# Patient Record
Sex: Female | Born: 1975 | Race: White | Hispanic: No | Smoking: Former smoker
Health system: Southern US, Community
[De-identification: ages and names within clinical notes are randomized; demographics above are authoritative.]

## PROBLEM LIST (undated history)

## (undated) DIAGNOSIS — E785 Hyperlipidemia, unspecified: Secondary | ICD-10-CM

## (undated) DIAGNOSIS — E079 Disorder of thyroid, unspecified: Secondary | ICD-10-CM

## (undated) DIAGNOSIS — E282 Polycystic ovarian syndrome: Secondary | ICD-10-CM

## (undated) DIAGNOSIS — R519 Headache, unspecified: Secondary | ICD-10-CM

## (undated) DIAGNOSIS — E039 Hypothyroidism, unspecified: Secondary | ICD-10-CM

## (undated) DIAGNOSIS — M26629 Arthralgia of temporomandibular joint, unspecified side: Secondary | ICD-10-CM

## (undated) DIAGNOSIS — D239 Other benign neoplasm of skin, unspecified: Secondary | ICD-10-CM

## (undated) DIAGNOSIS — F419 Anxiety disorder, unspecified: Secondary | ICD-10-CM

## (undated) DIAGNOSIS — I499 Cardiac arrhythmia, unspecified: Secondary | ICD-10-CM

## (undated) DIAGNOSIS — E063 Autoimmune thyroiditis: Secondary | ICD-10-CM

## (undated) DIAGNOSIS — B009 Herpesviral infection, unspecified: Secondary | ICD-10-CM

## (undated) DIAGNOSIS — A630 Anogenital (venereal) warts: Secondary | ICD-10-CM

## (undated) HISTORY — PX: BREAST SURGERY: SHX581

## (undated) HISTORY — DX: Anxiety disorder, unspecified: F41.9

## (undated) HISTORY — DX: Disorder of thyroid, unspecified: E07.9

## (undated) HISTORY — DX: Other benign neoplasm of skin, unspecified: D23.9

---

## 2006-11-21 ENCOUNTER — Ambulatory Visit: Payer: Self-pay | Admitting: Obstetrics & Gynecology

## 2006-12-02 ENCOUNTER — Ambulatory Visit: Payer: Self-pay | Admitting: Unknown Physician Specialty

## 2007-01-29 ENCOUNTER — Inpatient Hospital Stay: Payer: Self-pay

## 2007-02-10 ENCOUNTER — Ambulatory Visit: Payer: Self-pay

## 2009-05-27 ENCOUNTER — Emergency Department: Payer: Self-pay | Admitting: Unknown Physician Specialty

## 2010-07-16 ENCOUNTER — Encounter: Payer: Self-pay | Admitting: Obstetrics & Gynecology

## 2010-11-27 ENCOUNTER — Observation Stay: Payer: Self-pay | Admitting: Obstetrics and Gynecology

## 2010-11-28 ENCOUNTER — Ambulatory Visit: Payer: Self-pay | Admitting: Obstetrics and Gynecology

## 2010-12-04 ENCOUNTER — Observation Stay: Payer: Self-pay | Admitting: Obstetrics and Gynecology

## 2011-01-07 ENCOUNTER — Inpatient Hospital Stay: Payer: Self-pay | Admitting: Obstetrics and Gynecology

## 2013-11-15 ENCOUNTER — Ambulatory Visit: Payer: Self-pay | Admitting: Obstetrics and Gynecology

## 2013-12-08 ENCOUNTER — Ambulatory Visit: Payer: Self-pay | Admitting: Obstetrics and Gynecology

## 2013-12-28 ENCOUNTER — Ambulatory Visit: Payer: Self-pay | Admitting: Obstetrics and Gynecology

## 2013-12-29 LAB — PATHOLOGY REPORT

## 2014-09-19 DIAGNOSIS — E038 Other specified hypothyroidism: Secondary | ICD-10-CM | POA: Insufficient documentation

## 2014-09-19 DIAGNOSIS — M26629 Arthralgia of temporomandibular joint, unspecified side: Secondary | ICD-10-CM | POA: Insufficient documentation

## 2014-09-19 DIAGNOSIS — G5600 Carpal tunnel syndrome, unspecified upper limb: Secondary | ICD-10-CM | POA: Insufficient documentation

## 2014-09-19 DIAGNOSIS — E039 Hypothyroidism, unspecified: Secondary | ICD-10-CM | POA: Insufficient documentation

## 2014-09-19 DIAGNOSIS — E063 Autoimmune thyroiditis: Secondary | ICD-10-CM

## 2014-09-19 DIAGNOSIS — E785 Hyperlipidemia, unspecified: Secondary | ICD-10-CM | POA: Insufficient documentation

## 2014-11-07 DIAGNOSIS — IMO0002 Reserved for concepts with insufficient information to code with codable children: Secondary | ICD-10-CM | POA: Insufficient documentation

## 2015-06-11 IMAGING — US US BIOPSY BREAST CORE W/ IMAGING
1 series · 8 of 8 positions shown · non-contrast
Comparison: Previous exams.

CLINICAL DATA: 36-year-old female with a left breast mass at 3
o'clock 5 cm from the nipple.

EXAM:
ULTRASOUND GUIDED LEFT BREAST CORE NEEDLE BIOPSY

[Series 1: us biopsy breast core w/ imaging · 0.08mm/px · 8 of 8 slices shown]
[im 1/8]
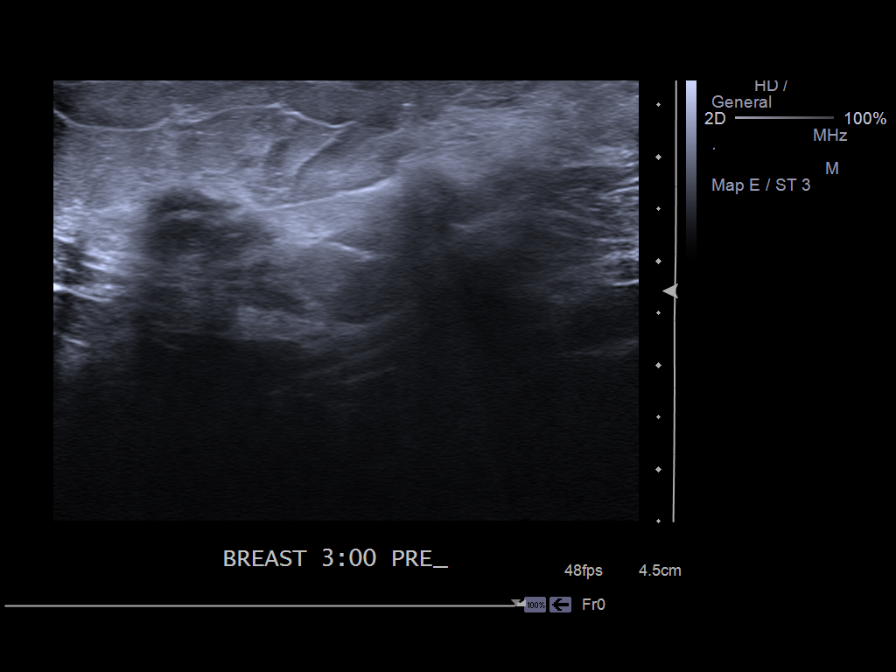
[im 2/8]
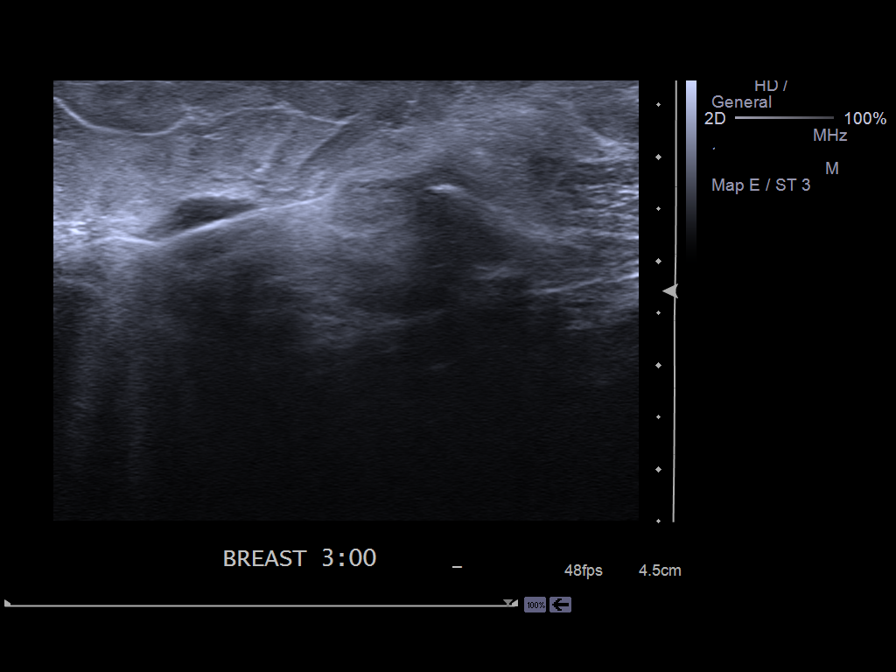
[im 3/8]
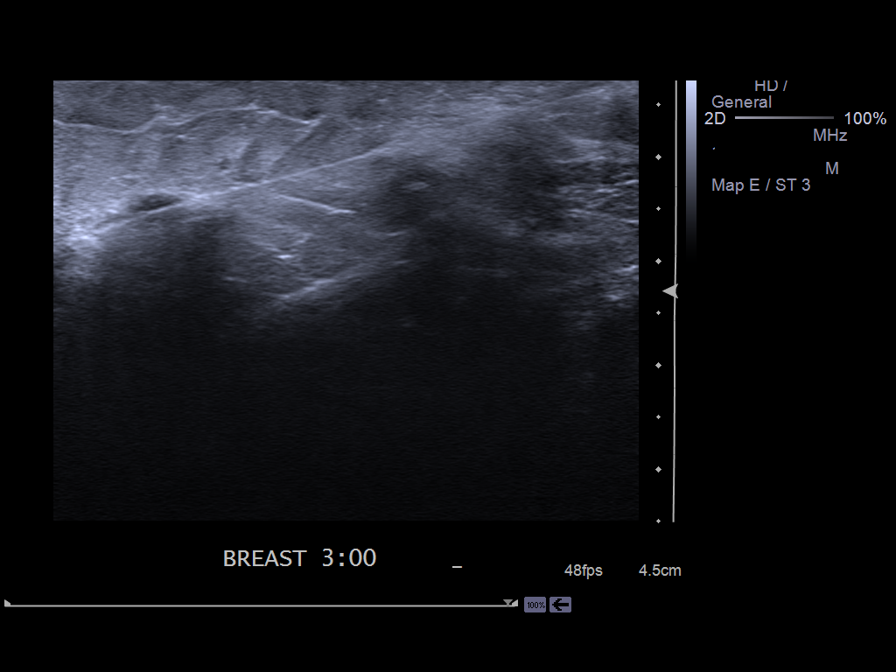
[im 4/8]
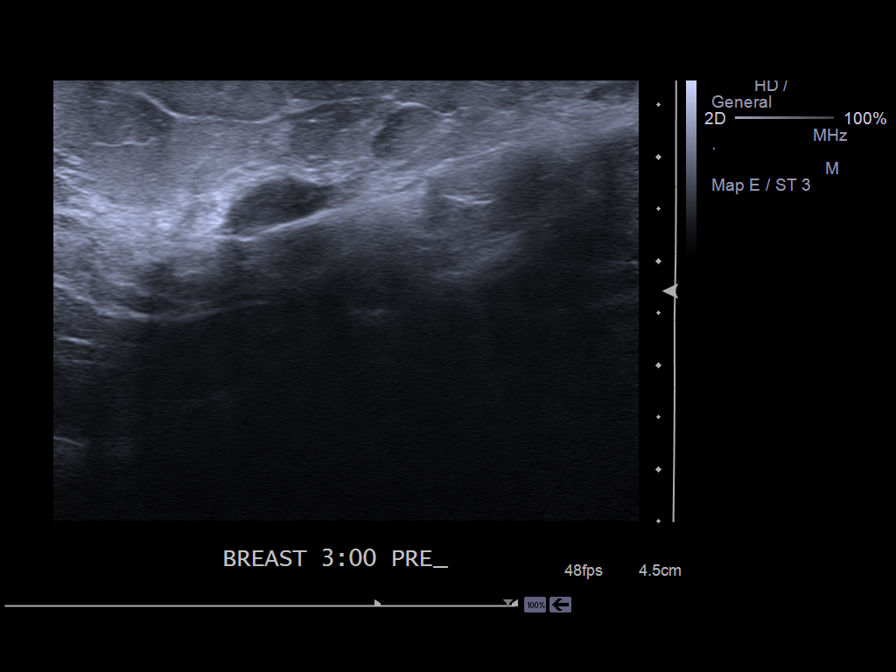
[im 5/8]
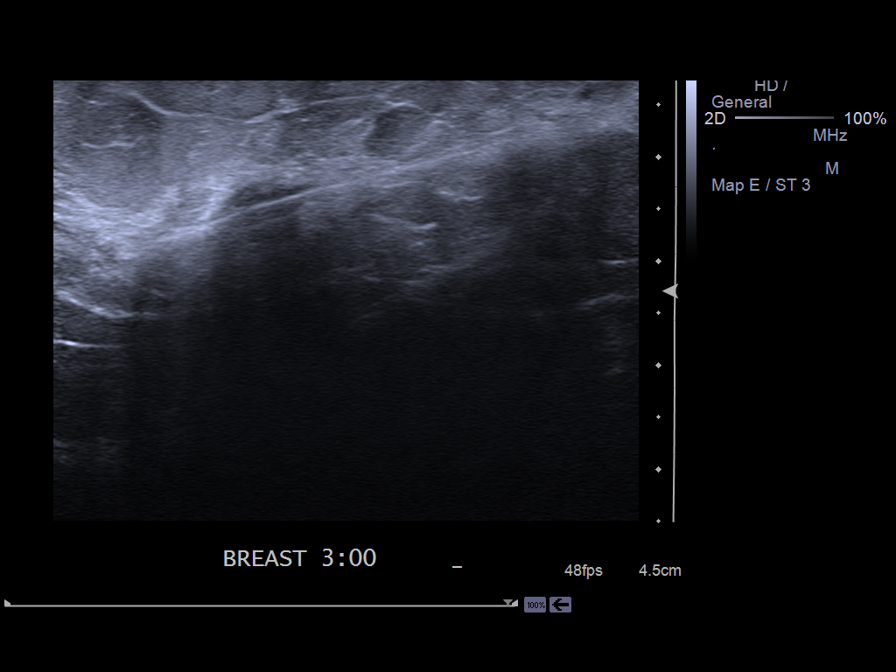
[im 6/8]
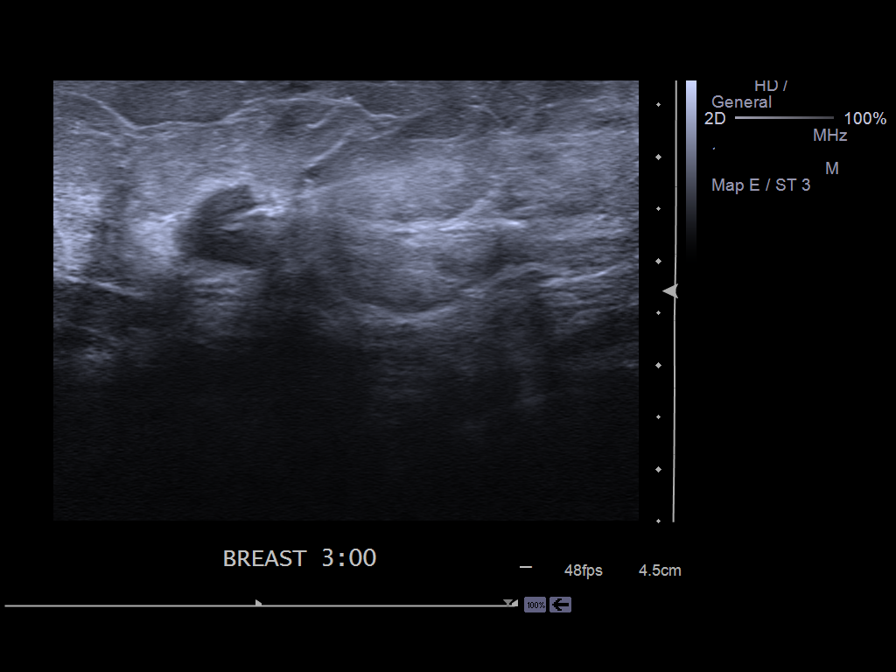
[im 7/8]
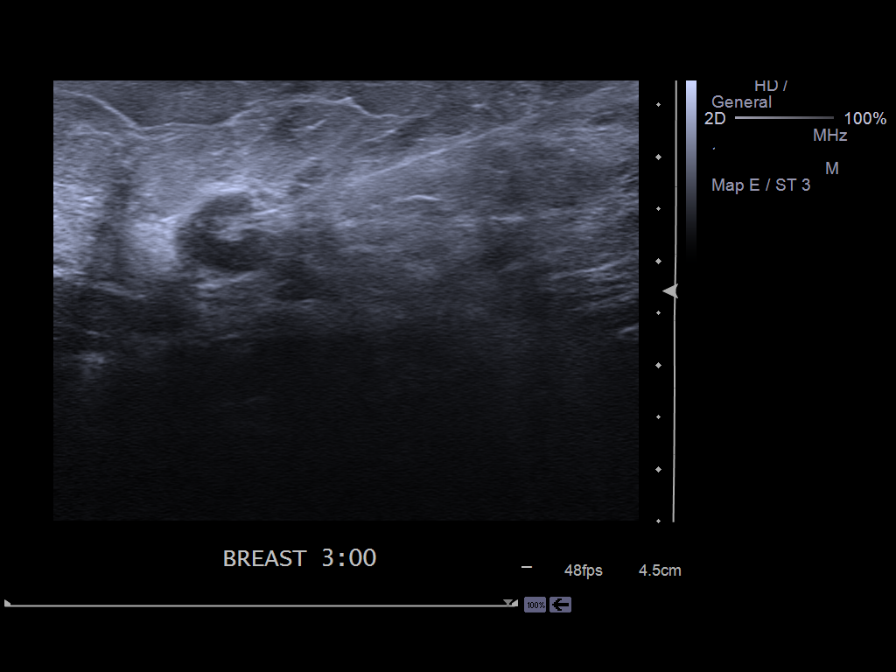
[im 8/8]
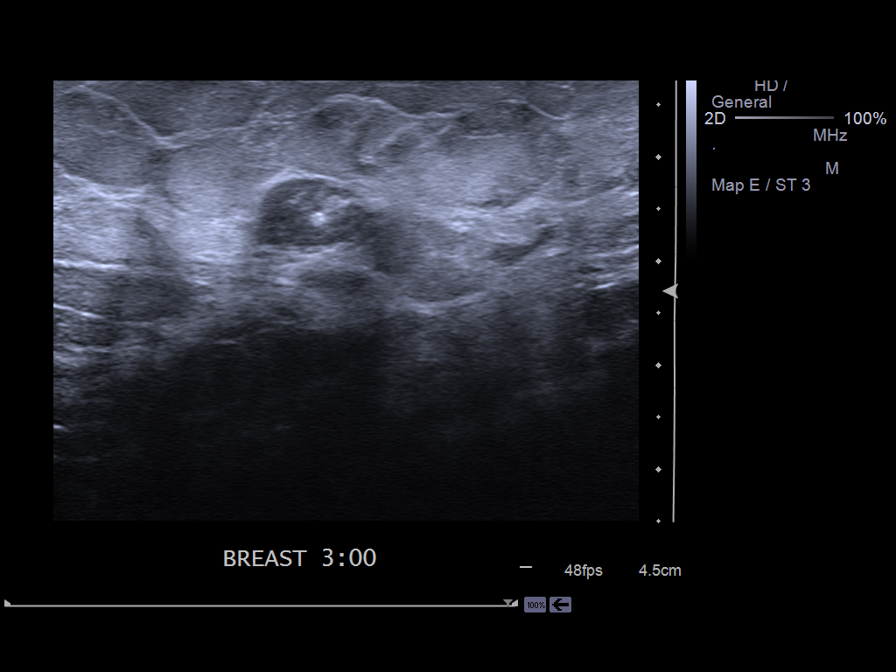

[8 of 8 positions shown; findings below may reference images not displayed]

FINDINGS: I met with the patient and we discussed the procedure o
fultrasound-guided biopsy, including benefits and alternatives. We
discussed the high likelihood of a successful procedure. We
discussed the risks of the procedure, including infection, bleeding,
tissue injury, clip migration, and inadequate sampling. Informed
written consent was given. The usual time-out protocol was performed
immediately prior to the procedure.

Using sterile technique and 2% Lidocaine as local anesthetic, under
direct ultrasound visualization, a 12 gauge vacuum device was used
to perform biopsy of the oval circumscribed mass in the left breast
at 3 o'clock 5 cm from the nipple using a lateral approach. At the
conclusion of the procedure a coil shaped tissue marker clip was
deployed into the biopsy cavity. Follow up 2 view mammogram was
performed and dictated separately.
IMPRESSION: Ultrasound guided biopsy of a left breast mass at 3 o'clock 5 cm
from the nipple. No apparent complications.

## 2015-10-03 ENCOUNTER — Ambulatory Visit: Payer: Self-pay | Admitting: Psychiatry

## 2015-10-11 ENCOUNTER — Ambulatory Visit (INDEPENDENT_AMBULATORY_CARE_PROVIDER_SITE_OTHER): Payer: BLUE CROSS/BLUE SHIELD | Admitting: Psychiatry

## 2015-10-11 ENCOUNTER — Encounter: Payer: Self-pay | Admitting: Psychiatry

## 2015-10-11 VITALS — BP 122/96 | HR 90 | Temp 98.7°F | Ht 62.0 in | Wt 157.0 lb

## 2015-10-11 DIAGNOSIS — F4323 Adjustment disorder with mixed anxiety and depressed mood: Secondary | ICD-10-CM

## 2015-10-11 NOTE — Progress Notes (Signed)
Psychiatric Initial Adult Assessment   Patient Identification: Linda Michael MRN:  [id number] Date of Evaluation:  10/11/2015 Referral Source: Self for/endocrinologist Chief Complaint:  "I'm here reluctantly." "So much in my life" "infidelity." Chief Complaint    Establish Care; Anxiety; Depression; Panic Attack; Fatigue; Stress     Visit Diagnosis:    ICD-9-CM ICD-10-CM   1. Adjustment disorder with mixed anxiety and depressed mood 309.28 F43.23    Diagnosis:   Patient Active Problem List   Diagnosis Date Noted  . Adult BMI 30+ [E66.8] 11/07/2014  . Carpal tunnel syndrome [G56.00] 09/19/2014  . HLD (hyperlipidemia) [E78.5] 09/19/2014  . Hypothyroidism due to Hashimoto's thyroiditis [E06.3] 09/19/2014  . 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 Adult hypothyroidism [E03.9] 09/19/2014   History of Present Illness:  Patient indicates that she has had past issues with depression. She states that over the past 3-4 years there have been issues with infidelity in her marriage. She states that subsequent to that she's been experiencing various emotions such as being irritable, sad, crying and some anxiety. She states that nothing tends to linger for continuous days but that it all depends on what is happening that day. For example she states that if she is sees something that reminds her of something that happened in the past such as the death of her grandmother which occurred in 2014 or someone that reminds her of the infidelity reportedly on her husband's part that then she began become emotional. We discussed whether she was having symptoms consistent with a major depressive disorder and it appeared that was not a case at this time.  He stated that she does occasionally have panic attacks where her chest feels tight, sharp palpitations. However she states that typically these are triggered when she is about to have an unpleasant conversation with someone.  She states that this first occurred about a year and a half ago and her primary care gave her some Xanax for which she still has the same supply. She states that she's not sure what the milligrams were but she states it was the "smallest." And that she would take a half of the tablets and felt like it probably work. She states she does not use this on a regular basis.  Review reviewed symptoms of mania and she denied any symptoms consistent with a hypomanic or manic episode. He states she still goes to work and is able to enjoy her children. She states that some avoidance in the relationship with her husband for example she states sometimes he'll be able to talk about the problems other times she might avoid it because she does not want to make him depressed. She states she is invested in saving the marriage because she wants to remain in intact family and provide a good family structure for her children. She states that she went through some employee-related counseling and then they discussed that she might want to see about medications. She states she discusses with her endocrinologist and then felt more comfortable with her seeing a psychiatrist.  He said in the past she was treated with some antidepressants when she was 39 years old. She states this was in the context of moving out of her father's home. She states at that time she had left her mother's home and was living with her father and her brother. She states she was largely the caretaker and functioned more and a mother role in that household. However she states she then met her current  husband and she moved out of the house. She states she was somewhat sad for leaving them and thus got started on some Zoloft secondary to having some guilt for leaving the house. She states she might stay on for 6 months to one year and then felt better. She states then probably around 11 years ago she took some Wellbutrin for about a half a year and felt good on that  medicine and then eventually stopped it.   Elements:  Duration:  As discussed above. Associated Signs/Symptoms: Depression Symptoms:  depressed mood, anxiety, panic attacks, loss of energy/fatigue, States she'll get irritable with people (Hypo) Manic Symptoms:  None Anxiety Symptoms:  She relates she'll some anxiety about unpleasant conversations. She states she has panic attacks but sons occur prior to having a conversation and sometimes after the conversation. She states she sometimes conscious and worries about what others think of her Psychotic Symptoms:  None PTSD Symptoms: NA  Past Medical History:  Past Medical History  Diagnosis Date  . Anxiety   . Thyroid disease     Past Surgical History  Procedure Laterality Date  . Cesarean section     Family History:  Family History  Problem Relation Age of Onset  . Drug abuse Mother   . Alcohol abuse Mother   . Depression Mother   . Anxiety disorder Mother   . Cervical polyp Mother   . Thyroid disease Mother   . Fibromyalgia Mother   . COPD Mother   . Drug abuse Father   . Anxiety disorder Father   . Depression Father   . Hypertension Father    Social History:   Social History   Social History  . Marital Status: Married    Spouse Name: N/A  . Number of Children: N/A  . Years of Education: N/A   Social History Main Topics  . Smoking status: Former Smoker    Types: Cigarettes    Start date: 10/10/1994    Quit date: 08/30/2013  . Smokeless tobacco: Never Used  . Alcohol Use: No  . Drug Use: No  . Sexual Activity: Yes    Birth Control/ Protection: None   Other Topics Concern  . None   Social History Narrative  . None   Additional Social History: Patient states that her parents separated when she was young. She states that both parents had some issues with drugs and alcohol. States her mother things were more related to alcohol use and she patient reports there may have been some addiction to pain  medications. She states her father had sporadic use of marijuana and some other drugs. She states it was never continuous but she states she was exposed to things that she probably should not have been as a child. She states that her father has been treated with antidepressants and believes he was treated with Celexa.  She relates that her grandmother was largely her maternal figure and that grandmother died in 03/09/13.  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 However since she left because she did not think she wants to go into the Insurance claims handler. 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  Musculoskeletal: Strength & Muscle Tone: within normal limits Gait & Station: normal Patient leans: N/A  Psychiatric Specialty Exam: HPI  Review of Systems  Endo/Heme/Allergies:  Hypothyroidism is currently treated TSH on 07/28/2015 was normal at 1.744  Psychiatric/Behavioral: Positive for depression. Negative for suicidal ideas, hallucinations, memory loss and substance abuse. The patient is nervous/anxious. The patient does not have insomnia.   All other systems reviewed and are negative.   Blood pressure 122/96, pulse 90, temperature 98.7 F (37.1 C), temperature source Tympanic, height 5' 2"  (1.575 m), weight 157 lb (71.215 kg), last menstrual period 09/19/2015, SpO2 95 %.Body mass index is 28.71 kg/(m^2).  General Appearance: Neat and Well Groomed  Eye Contact:  Good  Speech:  Normal Rate  Volume:  Normal  Mood:  Depression  Affect:  Appropriate and Constricted but able to smile  Thought Process:  Linear and Logical  Orientation:  Full (Time, Place, and Person)  Thought Content:  Negative  Suicidal Thoughts:  No  Homicidal Thoughts:  No  Memory:  Immediate;   Good Recent;   Good Remote;   Good   Judgement:  Good  Insight:  Good  Psychomotor Activity:  Negative  Concentration:  Good  Recall:  Good  Fund of Knowledge:Good  Language: Good  Akathisia:  Negative  Handed:    AIMS (if indicated): N/A  Assets:  Communication Skills Desire for Improvement Social Support Vocational/Educational  ADL's:  Intact  Cognition: WNL  Sleep:  Good   Is the patient at risk to self?  No. Has the patient been a risk to self in the past 6 months?  No. Has the patient been a risk to self within the distant past?  No. Is the patient a risk to others?  No. Has the patient been a risk to others in the past 6 months?  No. Has the patient been a risk to others within the distant past?  No.  Allergies:   Allergies  Allergen Reactions  . Sulfa Antibiotics Rash   Current Medications: Current Outpatient Prescriptions  Medication Sig Dispense Refill  . levothyroxine (LEVOXYL) 50 MCG tablet Take by mouth.    . simvastatin (ZOCOR) 40 MG tablet Take 40 mg by mouth.     No current facility-administered medications for this visit.    Previous Psychotropic Medications: Yes  As discussed above previous trials of Wellbutrin with good results. She states she's been on Zoloft and Prozac several years ago. Substance Abuse History in the last 12 months:  No. Patient states that she does not drink or use illicit drugs. She states that she did use cigarettes in her early 90s and then stopped for her pregnancies. She states she started back in 2013 in the midst of finding out about the affair but then quit in September 2014. Consequences of Substance Abuse: NA  Medical Decision Making:  New Problem, with no additional work-up planned (3)  Treatment Plan Summary: Plan Adjustment disorder with depressed she and an anxiety. Patient describes a lot of her emotions surrounding infidelity within her marriage over the past 3 or 4 years. She does not endorse symptoms which are consistent with a major depressive  disorder. She states she is able to function, go to work take care of her kids and enjoy her kids. She does not feel as though she is depressed constantly or her anxieties to level where it's impacting her ability to function. She is aware that counseling and therapy may address some of her issues, however she states that financially she is unable to engage in that treatment at this time. We spent time discussing some medication options such as medications for anxiety or  perhaps using an antidepressant to perhaps help her with her low moments. However patient indicated that she does think therapy is probably the best starting point and does not want to take any medications at this time. She stated that she will contact the clinic in the future should she decide to engage in medication management or therapy.  In regards to risk assessment the patient does have a history of being treated for affective illness and race as risk factors. She has protective factors of no past suicide attempts, minor children living in the home, some marital support although there is some stress within the marriage, employment, forward thinking and good insight. At this time low risk of imminent harm to self or others.    Faith Rogue 10/12/20164:17 PM

## 2020-04-06 ENCOUNTER — Encounter: Payer: Self-pay | Admitting: Dermatology

## 2020-04-06 ENCOUNTER — Ambulatory Visit (INDEPENDENT_AMBULATORY_CARE_PROVIDER_SITE_OTHER): Payer: BC Managed Care – PPO | Admitting: Dermatology

## 2020-04-06 ENCOUNTER — Other Ambulatory Visit: Payer: Self-pay

## 2020-04-06 DIAGNOSIS — Z1283 Encounter for screening for malignant neoplasm of skin: Secondary | ICD-10-CM

## 2020-04-06 DIAGNOSIS — L853 Xerosis cutis: Secondary | ICD-10-CM

## 2020-04-06 DIAGNOSIS — L720 Epidermal cyst: Secondary | ICD-10-CM

## 2020-04-06 DIAGNOSIS — D229 Melanocytic nevi, unspecified: Secondary | ICD-10-CM

## 2020-04-06 DIAGNOSIS — D2272 Melanocytic nevi of left lower limb, including hip: Secondary | ICD-10-CM

## 2020-04-06 DIAGNOSIS — L820 Inflamed seborrheic keratosis: Secondary | ICD-10-CM | POA: Diagnosis not present

## 2020-04-06 DIAGNOSIS — L578 Other skin changes due to chronic exposure to nonionizing radiation: Secondary | ICD-10-CM

## 2020-04-06 DIAGNOSIS — S40862A Insect bite (nonvenomous) of left upper arm, initial encounter: Secondary | ICD-10-CM | POA: Diagnosis not present

## 2020-04-06 DIAGNOSIS — L821 Other seborrheic keratosis: Secondary | ICD-10-CM

## 2020-04-06 DIAGNOSIS — D1800 Hemangioma unspecified site: Secondary | ICD-10-CM

## 2020-04-06 DIAGNOSIS — S40852A Superficial foreign body of left upper arm, initial encounter: Secondary | ICD-10-CM

## 2020-04-06 DIAGNOSIS — L814 Other melanin hyperpigmentation: Secondary | ICD-10-CM

## 2020-04-06 DIAGNOSIS — W57XXXA Bitten or stung by nonvenomous insect and other nonvenomous arthropods, initial encounter: Secondary | ICD-10-CM

## 2020-04-06 DIAGNOSIS — L988 Other specified disorders of the skin and subcutaneous tissue: Secondary | ICD-10-CM

## 2020-04-06 MED ORDER — DOXYCYCLINE MONOHYDRATE 100 MG PO CAPS: ORAL_CAPSULE | ORAL | 0 refills | Status: DC

## 2020-04-06 NOTE — Progress Notes (Signed)
Follow-Up Visit   Subjective  Linda Michael is a 44 y.o. female who presents for the following: Annual Exam (No history of skin cancer or abnormal moles).  Patient is here for skin cancer screening and mole check.   Last full skin exam: ~1 year New or changing lesions: none Otherwise denies growing, bleeding, or unhealing skin lesions.  Practices photoprotection with use sunscreen on face daily.  Tanning bed use: years ago History of blistering sunburns: more than 30 years ago   The following portions of the chart were reviewed this encounter and updated as appropriate: Tobacco  Allergies  Meds  Problems  Med Hx  Surg Hx  Fam Hx      Review of Systems: No other skin or systemic complaints.  Objective  Well appearing patient in no apparent distress; mood and affect are within normal limits.  A full examination was performed including scalp, head, eyes, ears, nose, lips, neck, chest, axillae, abdomen, back, buttocks, bilateral upper extremities, bilateral lower extremities, hands, feet, fingers, toes, fingernails, and toenails. All findings within normal limits unless otherwise noted below.  Objective  Face: Rhytides   Objective  Left Upper Arm - Anterior: Attached tick today.  Objective  left lat canthus: Smooth white papules  Objective  Left Middle Plantar Surface: 0.3 cm brown macule  Objective  Legs: Xerosis  Assessment & Plan    Skin cancer screening performed today.  Actinic Damage - diffuse scaly erythematous macules with underlying dyspigmentation - Recommend daily broad spectrum sunscreen SPF 30+ to sun-exposed areas, reapply every 2 hours as needed.  - Call for new or changing lesions.  Lentigines - Scattered tan macules - Discussed due to sun exposure - Benign, observe - Call for any changes  Seborrheic Keratoses - Stuck-on, waxy, tan-brown papules and plaques  - Discussed benign etiology and prognosis. - Observe - Call for any  changes  Melanocytic Nevi - Tan-brown and/or pink-flesh-colored symmetric macules and papules - Benign appearing on exam today - Observation - Call clinic for new or changing moles - Recommend daily use of broad spectrum spf 30+ sunscreen to sun-exposed areas.   Hemangiomas - Red papules - Discussed benign nature - Observe - Call for any changes  Elastosis of skin Face  Recommend The Perfect A daily   Topical retinoid medications like The Perfect A/tretinoin/Retin-A, adapalene/Differin, tazarotene/Fabior, and Epiduo/Epiduo Forte can cause dryness and irritation when first started. Only apply a pea-sized amount to the entire affected area. Avoid applying it around the eyes, edges of mouth and creases at the nose. If you experience irritation, use a good moisturizer first and/or apply the medicine less often. If you are doing well with the medicine, you can increase how often you use it until you are applying every night. Be careful with sun protection while using this medication as it can make you sensitive to the sun. This medicine should not be used by pregnant women.    Inflamed seborrheic keratosis (6) Right Shoulder - Anterior; Right Neck (4); Left Breast  Prior to procedure, discussed risks of blister formation, small wound, skin dyspigmentation, or rare scar following cryotherapy.    Destruction of lesion - Left Breast, Right Neck, Right Shoulder - Anterior  Destruction method: cryotherapy   Informed consent: discussed and consent obtained   Lesion destroyed using liquid nitrogen: Yes   Outcome: patient tolerated procedure well with no complications   Post-procedure details: wound care instructions given    Insect bite of left upper arm with  local reaction, initial encounter Left Upper Arm - Anterior  Deer tick removed today. Area prepped with isopropyl alcohol. Tick removed gently with forceps, including the head. Area cleansed again with alcohol.  Unclear how long  the tick has been attached. Will give prophylaxis for lyme disease 200 mg once with food.   doxycycline (MONODOX) 100 MG capsule - Left Upper Arm - Anterior  Milia left lat canthus  Observe Advised that The Perfect A can help with this but use minimally near the eyes due to risk of irritation   Nevus Left Middle Plantar Surface  Benign-appearing.  Observation.  Call clinic for new or changing moles.  Recommend daily use of broad spectrum spf 30+ sunscreen to sun-exposed areas.    Xerosis cutis Legs  Recommend Ammonium Lactate lotion.  Return in about 1 year (around 04/06/2021) for TBSE.   I, Ashok Cordia, CMA, am acting as scribe for Forest Gleason, MD .  Documentation: I have reviewed the above documentation for accuracy and completeness, and I agree with the above.  Forest Gleason, MD

## 2020-04-06 NOTE — Patient Instructions (Addendum)
Recommend daily broad spectrum sunscreen SPF 30+ to sun-exposed areas, reapply every 2 hours as needed. Call for new or changing lesions.   Recommend Ammonium Lactate lotion daily for dryness.  Recommend The Perfect A for sun spots and wrinkles (can also help milia- the tiny white cysts). Topical retinoid medications like tretinoin/Retin-A, adapalene/Differin, tazarotene/Fabior, and Epiduo/Epiduo Forte can cause dryness and irritation when first started. Only apply a pea-sized amount to the entire affected area. Avoid applying it around the eyes, edges of mouth and creases at the nose. If you experience irritation, use a good moisturizer first and/or apply the medicine less often. If you are doing well with the medicine, you can increase how often you use it until you are applying every night. Be careful with sun protection while using this medication as it can make you sensitive to the sun. This medicine should not be used by pregnant women.   Melanoma ABCDEs  Melanoma is the most dangerous type of skin cancer, and is the leading cause of death from skin disease.  You are more likely to develop melanoma if you:  Have light-colored skin, light-colored eyes, or red or blond hair  Spend a lot of time in the sun  Tan regularly, either outdoors or in a tanning bed  Have had blistering sunburns, especially during childhood  Have a close family member who has had a melanoma  Have atypical moles or large birthmarks  Early detection of melanoma is key since treatment is typically straightforward and cure rates are extremely high if we catch it early.   The first sign of melanoma is often a change in a mole or a new dark spot.  The ABCDE system is a way of remembering the signs of melanoma.  A for asymmetry:  The two halves do not match. B for border:  The edges of the growth are irregular. C for color:  A mixture of colors are present instead of an even brown color. D for diameter:  Melanomas  are usually (but not always) greater than 24mm - the size of a pencil eraser. E for evolution:  The spot keeps changing in size, shape, and color.  Please check your skin once per month between visits. You can use a small mirror in front and a large mirror behind you to keep an eye on the back side or your body.   If you see any new or changing lesions before your next follow-up, please call to schedule a visit.  Please continue daily skin protection including broad spectrum sunscreen SPF 30+ to sun-exposed areas, reapplying every 2 hours as needed when you're outdoors.

## 2021-04-12 ENCOUNTER — Encounter: Payer: BC Managed Care – PPO | Admitting: Dermatology

## 2021-08-23 ENCOUNTER — Other Ambulatory Visit: Payer: Self-pay

## 2021-08-23 ENCOUNTER — Ambulatory Visit (INDEPENDENT_AMBULATORY_CARE_PROVIDER_SITE_OTHER): Payer: BC Managed Care – PPO | Admitting: Dermatology

## 2021-08-23 DIAGNOSIS — L820 Inflamed seborrheic keratosis: Secondary | ICD-10-CM | POA: Diagnosis not present

## 2021-08-23 DIAGNOSIS — L814 Other melanin hyperpigmentation: Secondary | ICD-10-CM | POA: Diagnosis not present

## 2021-08-23 DIAGNOSIS — D225 Melanocytic nevi of trunk: Secondary | ICD-10-CM | POA: Diagnosis not present

## 2021-08-23 DIAGNOSIS — L578 Other skin changes due to chronic exposure to nonionizing radiation: Secondary | ICD-10-CM

## 2021-08-23 DIAGNOSIS — L821 Other seborrheic keratosis: Secondary | ICD-10-CM | POA: Diagnosis not present

## 2021-08-23 DIAGNOSIS — Z1283 Encounter for screening for malignant neoplasm of skin: Secondary | ICD-10-CM | POA: Diagnosis not present

## 2021-08-23 DIAGNOSIS — D229 Melanocytic nevi, unspecified: Secondary | ICD-10-CM

## 2021-08-23 DIAGNOSIS — D485 Neoplasm of uncertain behavior of skin: Secondary | ICD-10-CM

## 2021-08-23 DIAGNOSIS — D1800 Hemangioma unspecified site: Secondary | ICD-10-CM

## 2021-08-23 NOTE — Progress Notes (Signed)
Follow-Up Visit   Subjective  Linda Michael is a 45 y.o. female who presents for the following: Annual Exam (No hx of dysplastic nevi or skin CA - patient has noticed irregular, irritated lesions on the L upper arm, R arm, and back x 2).  The following portions of the chart were reviewed this encounter and updated as appropriate:   Tobacco  Allergies  Meds  Problems  Med Hx  Surg Hx  Fam Hx     Review of Systems:  No other skin or systemic complaints except as noted in HPI or Assessment and Plan.  Objective  Well appearing patient in no apparent distress; mood and affect are within normal limits.  A full examination was performed including scalp, head, eyes, ears, nose, lips, neck, chest, axillae, abdomen, back, buttocks, bilateral upper extremities, bilateral lower extremities, hands, feet, fingers, toes, fingernails, and toenails. All findings within normal limits unless otherwise noted below.  Assessment & Plan  Inflamed seborrheic keratosis L upper back, L sup shoulder, L upper arm  Destruction of lesion - L upper back, L sup shoulder, L upper arm Complexity: simple   Destruction method: cryotherapy   Informed consent: discussed and consent obtained   Timeout:  patient name, date of birth, surgical site, and procedure verified Lesion destroyed using liquid nitrogen: Yes   Region frozen until ice ball extended beyond lesion: Yes   Outcome: patient tolerated procedure well with no complications   Post-procedure details: wound care instructions given    Neoplasm of uncertain behavior of skin Upper back L of midline  Epidermal / dermal shaving  Lesion diameter (cm):  0.5 Informed consent: discussed and consent obtained   Timeout: patient name, date of birth, surgical site, and procedure verified   Procedure prep:  Patient was prepped and draped in usual sterile fashion Prep type:  Isopropyl alcohol Anesthesia: the lesion was anesthetized in a standard fashion    Anesthetic:  1% lidocaine w/ epinephrine 1-100,000 buffered w/ 8.4% NaHCO3 Instrument used: flexible razor blade   Hemostasis achieved with: pressure, aluminum chloride and electrodesiccation   Outcome: patient tolerated procedure well   Post-procedure details: sterile dressing applied and wound care instructions given   Dressing type: bandage and petrolatum    Specimen 1 - Surgical pathology Differential Diagnosis: D48.5 r/o dysplastic nevus  Check Margins: No  Lentigines - Scattered tan macules - Due to sun exposure - Benign-appering, observe - Recommend daily broad spectrum sunscreen SPF 30+ to sun-exposed areas, reapply every 2 hours as needed. - Call for any changes  Seborrheic Keratoses - Stuck-on, waxy, tan-brown papules and/or plaques  - Benign-appearing - Discussed benign etiology and prognosis. - Observe - Call for any changes  Melanocytic Nevi - Tan-brown and/or pink-flesh-colored symmetric macules and papules - Benign appearing on exam today - Observation - Call clinic for new or changing moles - Recommend daily use of broad spectrum spf 30+ sunscreen to sun-exposed areas.   Hemangiomas - Red papules - Discussed benign nature - Observe - Call for any changes  Actinic Damage - Chronic condition, secondary to cumulative UV/sun exposure - diffuse scaly erythematous macules with underlying dyspigmentation - Recommend daily broad spectrum sunscreen SPF 30+ to sun-exposed areas, reapply every 2 hours as needed.  - Staying in the shade or wearing long sleeves, sun glasses (UVA+UVB protection) and wide brim hats (4-inch brim around the entire circumference of the hat) are also recommended for sun protection.  - Call for new or changing lesions.  Skin  cancer screening performed today.  Return in about 1 year (around 08/23/2022) for TBSE.  Luther Redo, CMA, am acting as scribe for Forest Gleason, MD .  Documentation: I have reviewed the above documentation for  accuracy and completeness, and I agree with the above.  Forest Gleason, MD

## 2021-08-23 NOTE — Patient Instructions (Addendum)
If you have any questions or concerns for your doctor, please call our main line at [phone] and press option 4 to reach your doctor's medical assistant. If no one answers, please leave a voicemail as directed and we will return your call as soon as possible. Messages left after 4 pm will be answered the following business day.   You may also send us a message via MyChart. We typically respond to MyChart messages within 1-2 business days.  For prescription refills, please ask your pharmacy to contact our office. Our fax number is [phone].  If you have an urgent issue when the clinic is closed that cannot wait until the next business day, you can page your doctor at the number below.    Please note that while we do our best to be available for urgent issues outside of office hours, we are not available 24/7.   If you have an urgent issue and are unable to reach us, you may choose to seek medical care at your doctor's office, retail clinic, urgent care center, or emergency room.  If you have a medical emergency, please immediately call 911 or go to the emergency department.  Pager Numbers  - Dr. Kowalski: [phone]  - Dr. Moye: [phone]  - Dr. Stewart: [phone]  In the event of inclement weather, please call our main line at [phone] for an update on the status of any delays or closures.  Dermatology Medication Tips: Please keep the boxes that topical medications come in in order to help keep track of the instructions about where and how to use these. Pharmacies typically print the medication instructions only on the boxes and not directly on the medication tubes.   If your medication is too expensive, please contact our office at [phone] option 4 or send us a message through MyChart.   We are unable to tell what your co-pay for medications will be in advance as this is different depending on your insurance coverage. However, we may be able to find a substitute  medication at lower cost or fill out paperwork to get insurance to cover a needed medication.   If a prior authorization is required to get your medication covered by your insurance company, please allow us 1-2 business days to complete this process.  Drug prices often vary depending on where the prescription is filled and some pharmacies may offer cheaper prices.  The website www.goodrx.com contains coupons for medications through different pharmacies. The prices here do not account for what the cost may be with help from insurance (it may be cheaper with your insurance), but the website can give you the price if you did not use any insurance.  - You can print the associated coupon and take it with your prescription to the pharmacy.  - You may also stop by our office during regular business hours and pick up a GoodRx coupon card.  - If you need your prescription sent electronically to a different pharmacy, notify our office through [facility] MyChart or by phone at [phone] option 4.   Wound Care Instructions  Cleanse wound gently with soap and water once a day then pat dry with clean gauze. Apply a thing coat of Petrolatum (petroleum jelly, "Vaseline") over the wound (unless you have an allergy to this). We recommend that you use a new, sterile tube of Vaseline. Do not pick or remove scabs. Do not remove the yellow or white "healing tissue" from the base of the wound.  Cover the   wound with fresh, clean, nonstick gauze and secure with paper tape. You may use Band-Aids in place of gauze and tape if the would is small enough, but would recommend trimming much of the tape off as there is often too much. Sometimes Band-Aids can irritate the skin.  You should call the office for your biopsy report after 1 week if you have not already been contacted.  If you experience any problems, such as abnormal amounts of bleeding, swelling, significant bruising, significant pain, or evidence of infection,  please call the office immediately.  FOR ADULT SURGERY PATIENTS: If you need something for pain relief you may take 1 extra strength Tylenol (acetaminophen) AND 2 Ibuprofen (200mg each) together every 4 hours as needed for pain. (do not take these if you are allergic to them or if you have a reason you should not take them.) Typically, you may only need pain medication for 1 to 3 days.    

## 2021-08-26 ENCOUNTER — Encounter: Payer: Self-pay | Admitting: Dermatology

## 2021-08-29 ENCOUNTER — Telehealth: Payer: Self-pay

## 2021-08-29 NOTE — Telephone Encounter (Signed)
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 On the spectrum from normal mole to melanoma skin cancer, this is in between the two but closer towards a melanoma skin cancer.  - The treatment of choice for severely atypical moles is to cut them out in clinic with an area of normal looking skin around them to get all the atypical cells out. The skin that is removed will be sent to check under the microscope again to be sure it looks completely out.   - People who have a history of atypical moles do have a slightly increased risk of developing melanoma somewhere on the body, so a full body skin exam by a dermatologist is recommended at least once a year. - Monthly self skin checks and daily sun protection are also recommended.  - Please also call if you notice any new or changing spots anywhere else on the body before your follow-up visit.   Dr. Jerilynn Mages reviewed results with patient.   MAs please call to schedule excision. Thank you!

## 2021-10-02 ENCOUNTER — Ambulatory Visit (INDEPENDENT_AMBULATORY_CARE_PROVIDER_SITE_OTHER): Payer: BC Managed Care – PPO | Admitting: Dermatology

## 2021-10-02 ENCOUNTER — Other Ambulatory Visit: Payer: Self-pay

## 2021-10-02 ENCOUNTER — Encounter: Payer: Self-pay | Admitting: Dermatology

## 2021-10-02 DIAGNOSIS — D485 Neoplasm of uncertain behavior of skin: Secondary | ICD-10-CM

## 2021-10-02 DIAGNOSIS — D225 Melanocytic nevi of trunk: Secondary | ICD-10-CM | POA: Diagnosis not present

## 2021-10-02 DIAGNOSIS — D239 Other benign neoplasm of skin, unspecified: Secondary | ICD-10-CM

## 2021-10-02 HISTORY — DX: Other benign neoplasm of skin, unspecified: D23.9

## 2021-10-02 MED ORDER — MUPIROCIN 2 % EX OINT: 1.0000 "application " | TOPICAL_OINTMENT | Freq: Every day | CUTANEOUS | 0 refills | Status: DC

## 2021-10-02 NOTE — Progress Notes (Signed)
   Follow-Up Visit   Subjective  Linda Michael is a 45 y.o. female who presents for the following: Procedure (Patient here today for excision of DYSPLASTIC NEVUS WITH MODERATE TO SEVERE ATYPIA at upper back L of midline./).    The following portions of the chart were reviewed this encounter and updated as appropriate:   Tobacco  Allergies  Meds  Problems  Med Hx  Surg Hx  Fam Hx      Review of Systems:  No other skin or systemic complaints except as noted in HPI or Assessment and Plan.  Objective  Well appearing patient in no apparent distress; mood and affect are within normal limits.  A focused examination was performed including back. Relevant physical exam findings are noted in the Assessment and Plan.  upper back left of midline Pink healing biopsy site   Assessment & Plan  Neoplasm of uncertain behavior of skin upper back left of midline  Skin excision  Lesion length (cm):  1.1 Total excision diameter (cm):  1.7 Informed consent: discussed and consent obtained   Timeout: patient name, date of birth, surgical site, and procedure verified   Procedure prep:  Patient was prepped and draped in usual sterile fashion Prep type:  Chlorhexidine Anesthesia: the lesion was anesthetized in a standard fashion   Anesthetic:  1% lidocaine w/ epinephrine 1-100,000 buffered w/ 8.4% NaHCO3 (3cc lido w/epi, 98mm 0.25% bupivicaine) Instrument used: #10 blade   Hemostasis achieved with: suture, pressure and electrodesiccation   Outcome: patient tolerated procedure well with no complications   Post-procedure details: wound care instructions given   Additional details:  Mupirocin and a pressure dressing applied  Skin repair Complexity:  Intermediate Final length (cm):  5.3 Informed consent: discussed and consent obtained   Timeout: patient name, date of birth, surgical site, and procedure verified   Procedure prep:  Patient was prepped and draped in usual sterile  fashion Prep type:  Chlorhexidine Anesthesia: the lesion was anesthetized in a standard fashion   Anesthetic:  1% lidocaine w/ epinephrine 1-100,000 local infiltration Reason for type of repair: reduce tension to allow closure, reduce the risk of dehiscence, infection, and necrosis, allow closure of the large defect, preserve normal anatomy and enhance both functionality and cosmetic results   Undermining: edges undermined   Subcutaneous layers (deep stitches):  Suture size:  3-0 Suture type: Vicryl (polyglactin 910)   Stitches:  Buried vertical mattress Fine/surface layer approximation (top stitches):  Suture size:  4-0 Suture type: PDS (polydioxanone) and Prolene (polypropylene)   Suture removal (days):  7 Hemostasis achieved with: suture, pressure and electrodesiccation Outcome: patient tolerated procedure well with no complications   Post-procedure details: wound care instructions given   Additional details:  Mupirocin and a pressure bandage applied   mupirocin ointment (BACTROBAN) 2 % Apply 1 application topically daily. With dressing changes  Specimen 1 - Surgical pathology Differential Diagnosis: Bx proven DYSPLASTIC NEVUS WITH MODERATE TO SEVERE ATYPIA  Check Margins: yes Pink healing biopsy site [id number] Tagged at 7 o'clock  Return in about 1 week (around 10/09/2021) for Suture Removal.  Graciella Belton, RMA, am acting as scribe for Forest Gleason, MD .   Documentation: I have reviewed the above documentation for accuracy and completeness, and I agree with the above.  Forest Gleason, MD

## 2021-10-02 NOTE — Patient Instructions (Addendum)
Wound Care Instructions  Cleanse wound gently with soap and water once a day then pat dry with clean gauze. Apply a thing coat of Petrolatum (petroleum jelly, "Vaseline") over the wound (unless you have an allergy to this). We recommend that you use a new, sterile tube of Vaseline. Do not pick or remove scabs. Do not remove the yellow or white "healing tissue" from the base of the wound.  Cover the wound with fresh, clean, nonstick gauze and secure with paper tape. You may use Band-Aids in place of gauze and tape if the would is small enough, but would recommend trimming much of the tape off as there is often too much. Sometimes Band-Aids can irritate the skin.  You should call the office for your biopsy report after 1 week if you have not already been contacted.  If you experience any problems, such as abnormal amounts of bleeding, swelling, significant bruising, significant pain, or evidence of infection, please call the office immediately.  FOR ADULT SURGERY PATIENTS: If you need something for pain relief you may take 1 extra strength Tylenol (acetaminophen) AND 2 Ibuprofen (200mg each) together every 4 hours as needed for pain. (do not take these if you are allergic to them or if you have a reason you should not take them.) Typically, you may only need pain medication for 1 to 3 days.   If you have any questions or concerns for your doctor, please call our main line at [phone] and press option 4 to reach your doctor's medical assistant. If no one answers, please leave a voicemail as directed and we will return your call as soon as possible. Messages left after 4 pm will be answered the following business day.   You may also send us a message via MyChart. We typically respond to MyChart messages within 1-2 business days.  For prescription refills, please ask your pharmacy to contact our office. Our fax number is [phone].  If you have an urgent issue when the clinic is closed that  cannot wait until the next business day, you can page your doctor at the number below.    Please note that while we do our best to be available for urgent issues outside of office hours, we are not available 24/7.   If you have an urgent issue and are unable to reach us, you may choose to seek medical care at your doctor's office, retail clinic, urgent care center, or emergency room.  If you have a medical emergency, please immediately call 911 or go to the emergency department.  Pager Numbers  - Dr. Kowalski: [phone]  - Dr. Moye: [phone]  - Dr. Stewart: [phone]  In the event of inclement weather, please call our main line at [phone] for an update on the status of any delays or closures.  Dermatology Medication Tips: Please keep the boxes that topical medications come in in order to help keep track of the instructions about where and how to use these. Pharmacies typically print the medication instructions only on the boxes and not directly on the medication tubes.   If your medication is too expensive, please contact our office at [phone] option 4 or send us a message through MyChart.   We are unable to tell what your co-pay for medications will be in advance as this is different depending on your insurance coverage. However, we may be able to find a substitute medication at lower cost or fill out paperwork to get insurance to cover a needed   medication.   If a prior authorization is required to get your medication covered by your insurance company, please allow us 1-2 business days to complete this process.  Drug prices often vary depending on where the prescription is filled and some pharmacies may offer cheaper prices.  The website www.goodrx.com contains coupons for medications through different pharmacies. The prices here do not account for what the cost may be with help from insurance (it may be cheaper with your insurance), but the website can give you the  price if you did not use any insurance.  - You can print the associated coupon and take it with your prescription to the pharmacy.  - You may also stop by our office during regular business hours and pick up a GoodRx coupon card.  - If you need your prescription sent electronically to a different pharmacy, notify our office through [facility] MyChart or by phone at [phone] option 4.   

## 2021-10-03 ENCOUNTER — Telehealth: Payer: Self-pay

## 2021-10-03 NOTE — Telephone Encounter (Signed)
Left pt message to see how she is doing after yesterday's surgery and to call office if any questions or concerns.Linda Michael

## 2021-10-04 ENCOUNTER — Telehealth: Payer: Self-pay

## 2021-10-04 NOTE — Telephone Encounter (Signed)
-----   Message from Alfonso Patten, MD sent at 10/04/2021  2:04 PM EDT ----- Skin (M), upper back left of midline EXCISION, PERSISTENT DYSPLASTIC NEVUS, MARGINS FREE  Entire lesion appears to be out. No additional treatment needed at this time. Please call our office [phone] with any questions before suture removal.   MAs please call. Thank you!

## 2021-10-07 ENCOUNTER — Encounter: Payer: Self-pay | Admitting: Dermatology

## 2021-10-09 ENCOUNTER — Other Ambulatory Visit: Payer: Self-pay

## 2021-10-09 ENCOUNTER — Ambulatory Visit (INDEPENDENT_AMBULATORY_CARE_PROVIDER_SITE_OTHER): Payer: BC Managed Care – PPO | Admitting: Dermatology

## 2021-10-09 DIAGNOSIS — Z4802 Encounter for removal of sutures: Secondary | ICD-10-CM

## 2021-10-09 NOTE — Progress Notes (Signed)
   Follow-Up Visit   Subjective  CAELYN ROUTE is a 45 y.o. female who presents for the following: Follow-up (Patient here today for suture removal at upper back left of midline.).   The following portions of the chart were reviewed this encounter and updated as appropriate:   Tobacco  Allergies  Meds  Problems  Med Hx  Surg Hx  Fam Hx      Review of Systems:  No other skin or systemic complaints except as noted in HPI or Assessment and Plan.  Objective  Well appearing patient in no apparent distress; mood and affect are within normal limits.  A focused examination was performed including back. Relevant physical exam findings are noted in the Assessment and Plan.    Assessment & Plan   Encounter for Removal of Sutures - Incision site at the upper back left of midline is clean, dry and intact - Wound cleansed, sutures removed, wound cleansed and steri strips applied.  - Discussed pathology results showing margins free  - Patient advised to keep steri-strips dry until they fall off. - Scars remodel for a full year. - Once steri-strips fall off, patient can apply over-the-counter silicone scar cream each night to help with scar remodeling if desired. - Patient advised to call with any concerns or if they notice any new or changing lesions.  Return in about 4 months (around 02/09/2022) for TBSE.  Graciella Belton, RMA, am acting as scribe for Forest Gleason, MD .  Documentation: I have reviewed the above documentation for accuracy and completeness, and I agree with the above.  Forest Gleason, MD

## 2021-10-09 NOTE — Patient Instructions (Addendum)
Recommend Serica moisturizing scar formula cream every night or [facility] brand or Mederma silicone scar sheet every night for the first year after a scar appears to help with scar remodeling if desired. Scars remodel on their own for a full year.    If you have any questions or concerns for your doctor, please call our main line at [phone] and press option 4 to reach your doctor's medical assistant. If no one answers, please leave a voicemail as directed and we will return your call as soon as possible. Messages left after 4 pm will be answered the following business day.   You may also send [place] a message via [place]. We typically respond to MyChart messages within 1-2 business days.  For prescription refills, please ask your pharmacy to contact our office. Our fax number is [phone].  If you have an urgent issue when the clinic is closed that cannot wait until the next business day, you can page your doctor at the number below.    Please note that while we do our best to be available for urgent issues outside of office hours, we are not available 24/7.   If you have an urgent issue and are unable to reach [place], you may choose to seek medical care at your doctor's office, retail clinic, urgent care center, or emergency room.  If you have a medical emergency, please immediately call 911 or go to the emergency department.  Pager Numbers  - Dr. Nehemiah Massed: [phone]  - Dr. Laurence Ferrari: [phone]  - Dr. Nicole Kindred: [phone]  In the event of inclement weather, please call our main line at [phone] for an update on the status of any delays or closures.  Dermatology Medication Tips: Please keep the boxes that topical medications come in in order to help keep track of the instructions about where and how to use these. Pharmacies typically print the medication instructions only on the boxes and not directly on the medication tubes.   If your medication is too expensive, please contact  our office at [phone] option 4 or send [place] a message through [facility].   We are unable to tell what your co-pay for medications will be in advance as this is different depending on your insurance coverage. However, we may be able to find a substitute medication at lower cost or fill out paperwork to get insurance to cover a needed medication.   If a prior authorization is required to get your medication covered by your insurance company, please allow [place] 1-2 business days to complete this process.  Drug prices often vary depending on where the prescription is filled and some pharmacies may offer cheaper prices.  The website www.goodrx.com contains coupons for medications through different pharmacies. The prices here do not account for what the cost may be with help from insurance (it may be cheaper with your insurance), but the website can give you the price if you did not use any insurance.  - You can print the associated coupon and take it with your prescription to the pharmacy.  - You may also stop by our office during regular business hours and pick up a GoodRx coupon card.  - If you need your prescription sent electronically to a different pharmacy, notify our office through [facility] or by phone at [phone] option 4.

## 2021-10-10 ENCOUNTER — Encounter: Payer: Self-pay | Admitting: Dermatology

## 2021-12-27 ENCOUNTER — Other Ambulatory Visit: Payer: Self-pay

## 2021-12-27 ENCOUNTER — Emergency Department
Admission: EM | Admit: 2021-12-27 | Discharge: 2021-12-28 | Disposition: A | Discharge status: Home / Self Care | Payer: BC Managed Care – PPO | Attending: Emergency Medicine | Admitting: Emergency Medicine

## 2021-12-27 ENCOUNTER — Encounter: Payer: Self-pay | Admitting: Emergency Medicine

## 2021-12-27 ENCOUNTER — Emergency Department: Payer: BC Managed Care – PPO

## 2021-12-27 DIAGNOSIS — R002 Palpitations: Secondary | ICD-10-CM | POA: Diagnosis present

## 2021-12-27 DIAGNOSIS — Z87891 Personal history of nicotine dependence: Secondary | ICD-10-CM | POA: Insufficient documentation

## 2021-12-27 DIAGNOSIS — E039 Hypothyroidism, unspecified: Secondary | ICD-10-CM | POA: Insufficient documentation

## 2021-12-27 DIAGNOSIS — R778 Other specified abnormalities of plasma proteins: Secondary | ICD-10-CM

## 2021-12-27 DIAGNOSIS — Z79899 Other long term (current) drug therapy: Secondary | ICD-10-CM | POA: Insufficient documentation

## 2021-12-27 DIAGNOSIS — I471 Supraventricular tachycardia: Secondary | ICD-10-CM | POA: Diagnosis not present

## 2021-12-27 DIAGNOSIS — Z20822 Contact with and (suspected) exposure to covid-19: Secondary | ICD-10-CM | POA: Insufficient documentation

## 2021-12-27 DIAGNOSIS — R7989 Other specified abnormal findings of blood chemistry: Secondary | ICD-10-CM | POA: Diagnosis not present

## 2021-12-27 LAB — CBC WITH DIFFERENTIAL/PLATELET
Abs Immature Granulocytes: 0.04 10*3/uL (ref 0.00–0.07)
Basophils Absolute: 0.1 10*3/uL (ref 0.0–0.1)
Basophils Relative: 1 %
Eosinophils Absolute: 0.3 10*3/uL (ref 0.0–0.5)
Eosinophils Relative: 3 %
HCT: 44.6 % (ref 36.0–46.0)
Hemoglobin: 15.2 g/dL — ABNORMAL HIGH (ref 12.0–15.0)
Immature Granulocytes: 0 %
Lymphocytes Relative: 21 %
Lymphs Abs: 2 10*3/uL (ref 0.7–4.0)
MCH: 31.9 pg (ref 26.0–34.0)
MCHC: 34.1 g/dL (ref 30.0–36.0)
MCV: 93.5 fL (ref 80.0–100.0)
Monocytes Absolute: 0.6 10*3/uL (ref 0.1–1.0)
Monocytes Relative: 6 %
Neutro Abs: 6.7 10*3/uL (ref 1.7–7.7)
Neutrophils Relative %: 69 %
Platelets: 283 10*3/uL (ref 150–400)
RBC: 4.77 MIL/uL (ref 3.87–5.11)
RDW: 12.1 % (ref 11.5–15.5)
WBC: 9.6 10*3/uL (ref 4.0–10.5)
nRBC: 0 % (ref 0.0–0.2)

## 2021-12-27 LAB — URINALYSIS, ROUTINE W REFLEX MICROSCOPIC
Bilirubin Urine: NEGATIVE
Glucose, UA: NEGATIVE mg/dL
Hgb urine dipstick: NEGATIVE
Ketones, ur: NEGATIVE mg/dL
Nitrite: NEGATIVE
Protein, ur: NEGATIVE mg/dL
Specific Gravity, Urine: 1.005 (ref 1.005–1.030)
pH: 7 (ref 5.0–8.0)

## 2021-12-27 LAB — BASIC METABOLIC PANEL
Anion gap: 7 (ref 5–15)
BUN: 17 mg/dL (ref 6–20)
CO2: 24 mmol/L (ref 22–32)
Calcium: 9.1 mg/dL (ref 8.9–10.3)
Chloride: 104 mmol/L (ref 98–111)
Creatinine, Ser: 0.62 mg/dL (ref 0.44–1.00)
GFR, Estimated: >60 mL/min (ref >60)
Glucose, Bld: 129 mg/dL — ABNORMAL HIGH (ref 70–99)
Potassium: 3.3 mmol/L — ABNORMAL LOW (ref 3.5–5.1)
Sodium: 135 mmol/L (ref 135–145)

## 2021-12-27 LAB — TROPONIN I (HIGH SENSITIVITY)
Troponin I (High Sensitivity): 67 ng/L — ABNORMAL HIGH (ref <18)
Troponin I (High Sensitivity): 298 ng/L (ref <18)

## 2021-12-27 LAB — PREGNANCY, URINE: Preg Test, Ur: NEGATIVE

## 2021-12-27 LAB — RESP PANEL BY RT-PCR (FLU A&B, COVID) ARPGX2
Influenza A by PCR: NEGATIVE
Influenza B by PCR: NEGATIVE
SARS Coronavirus 2 by RT PCR: NEGATIVE

## 2021-12-27 LAB — TSH: TSH: 1.987 u[IU]/mL (ref 0.350–4.500)

## 2021-12-27 LAB — POC URINE PREG, ED: Preg Test, Ur: NEGATIVE

## 2021-12-27 NOTE — ED Provider Notes (Signed)
[facility] Emergency Department Provider Note   ____________________________________________   Event Date/Time   First MD Initiated Contact with Patient 12/27/21 1840     (approximate)  I have reviewed the triage vital signs and the nursing notes.   HISTORY  Chief Complaint Palpitations    HPI Linda Michael is a 45 y.o. female with past medical history of hyperlipidemia, hypothyroidism, and anxiety who presents to the ED complaining of palpitations.  Patient reports that she has been dealing with intermittent sensation of her heart racing for the past few years, however it typically only last for 30 seconds at a time before resolving.  Today while she was driving, she had similar palpitations that she was not able to resolve on her own.  She drove herself to a local fire station, where they checked her heart rate and found it to be greater than 200.  EMS was called and patient was given 6 mg of adenosine followed by 12 mg of adenosine for SVT, arrhythmia seem to resolve following 12 mg dose.  Patient states that she felt heavy in her chest with the sensation of her heart racing, denies any difficulty breathing.  She now feels back to normal following the adenosine, denies any chest pain.  She had been feeling well recently with no fevers, cough, abdominal pain, nausea, vomiting, diarrhea, or dysuria.  Patient denies significant caffeine intake and denies cocaine abuse.        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 Authorizing Provider  levothyroxine (SYNTHROID) 50 MCG tablet Take 50 mcg by mouth daily before breakfast. Patient not taking: Reported on 12/27/2021 05/15/21   [provider]    Allergies Sulfa antibiotics  Family History  Problem Relation Age of Onset   Drug abuse Mother    Alcohol abuse Mother    Depression Mother    Anxiety disorder Mother    Cervical polyp Mother    Thyroid disease Mother    Fibromyalgia Mother    COPD Mother    Drug abuse Father    Anxiety disorder Father    Depression Father    Hypertension Father     Social History Social History   Tobacco Use   Smoking status: Former    Types: Cigarettes    Start date: 10/10/1994    Quit date: 08/30/2013    Years since quitting: 8.3   Smokeless tobacco: Never  Substance Use Topics   Alcohol use: No    Alcohol/week: 0.0 standard drinks   Drug use: No    Review of Systems  Constitutional: No fever/chills Eyes: No visual changes. ENT: No sore throat. Cardiovascular: Positive for palpitations and chest pain. Respiratory: Denies shortness of breath. Gastrointestinal: No abdominal pain.  No nausea, no vomiting.  No diarrhea.  No constipation. Genitourinary: Negative for dysuria. Musculoskeletal: Negative for back pain. Skin: Negative for rash.  Neurological: Negative for headaches, focal weakness or numbness.  ____________________________________________   PHYSICAL EXAM:  VITAL SIGNS: ED Triage Vitals  Enc Vitals Group     BP      Pulse      Resp      Temp      Temp src      SpO2      Weight      Height      Head Circumference      Peak Flow      Pain Score      Pain Loc      Pain Edu?      Excl. in [place]?     Constitutional: Alert and oriented. Eyes: Conjunctivae are normal. Head: Atraumatic. Nose: No  congestion/rhinnorhea. Mouth/Throat: Mucous membranes are moist. Neck: Normal ROM Cardiovascular: Normal rate, regular rhythm. Grossly normal heart sounds.  2+ radial pulses bilaterally. Respiratory: Normal respiratory effort.  No retractions. Lungs CTAB. Gastrointestinal: Soft and nontender. No distention. Genitourinary: deferred Musculoskeletal: No lower extremity tenderness nor edema. Neurologic:  Normal speech and language. No gross focal neurologic deficits are appreciated. Skin:  Skin is warm, dry and intact. No rash noted. Psychiatric: Mood and affect are normal. Speech and behavior are normal.  ____________________________________________   LABS (all labs ordered are listed, but only abnormal results are displayed)  Labs Reviewed  CBC WITH DIFFERENTIAL/PLATELET - Abnormal; Notable for the following components:      Result Value   Hemoglobin 15.2 (*)    All other components within normal limits  BASIC METABOLIC PANEL - Abnormal; Notable for the following components:   Potassium 3.3 (*)    Glucose, Bld 129 (*)    All other components within normal limits  URINALYSIS, ROUTINE W REFLEX MICROSCOPIC - Abnormal; Notable for the following components:   Color, Urine STRAW (*)    APPearance HAZY (*)    Leukocytes,Ua LARGE (*)    Bacteria, UA RARE (*)    All other components within normal limits  TROPONIN I (HIGH SENSITIVITY) - Abnormal; Notable for the following components:   Troponin I (High Sensitivity) 67 (*)    All other components within normal limits  TROPONIN I (HIGH SENSITIVITY) - Abnormal; Notable for the following components:   Troponin I (High Sensitivity) 298 (*)    All other components within normal limits  RESP PANEL BY RT-PCR (FLU A&B, COVID) ARPGX2  TSH  PREGNANCY, URINE  POC URINE PREG, ED   ____________________________________________  EKG  ED ECG REPORT I, Blake Divine, the attending physician, personally viewed and interpreted this ECG.   Date:  12/27/2021  EKG Time: 18:54  Rate: 104  Rhythm: sinus tachycardia  Axis: Normal  Intervals:none  ST&T Change: None   PROCEDURES  Procedure(s) performed (including Critical Care):  Procedures   ____________________________________________   INITIAL IMPRESSION / ASSESSMENT AND PLAN / ED COURSE      45 year old female with past medical history of hyperlipidemia, hypothyroidism, and anxiety presents to the ED complaining of episode of palpitations persisting for 20 to 30 minutes, resolving following dose of adenosine.  Rhythm strips were reviewed from EMS and consistent with SVT, follow-up EKG here in the ED is unremarkable with no ischemic changes.  We will observe on cardiac monitor and check labs for electrolyte abnormality or abnormal thyroid function.  Patient also noted to be febrile here in the ED but denies any symptoms of infection, we will check chest x-ray, UA, and testing for COVID-19.  Chest x-ray reviewed by  me and shows no infiltrate, edema, or effusion.  UA also shows no evidence of infection and pregnancy testing is negative.  Troponin noted to be mildly elevated however patient remains asymptomatic with no further episodes of SVT here in the ED.  Troponin is uptrending on recheck and this was discussed with Dr. Rayann Heman of cardiology.  He states that patient does not require admission as troponin elevation is expected for patient's extended run of SVT.  He recommends discharge home with outpatient cardiology follow-up, does not recommend starting metoprolol at this time given patient's prior episodes have been infrequent.  Patient was counseled to return to the ED for new worsening symptoms, patient agrees with plan.      ____________________________________________   FINAL CLINICAL IMPRESSION(S) / ED DIAGNOSES  Final diagnoses:  SVT (supraventricular tachycardia) (HCC)  Elevated troponin     ED Discharge Orders     None        Note:  This document was  prepared using Dragon voice recognition software and may include unintentional dictation errors.    Blake Divine, MD 12/27/21 [phone]

## 2021-12-27 NOTE — ED Triage Notes (Signed)
Pt came via [facility]. Per EMS, pt was driving and she started to feel her heart racing and pulled over and went to fire station and when EMS arrived pt's HR was in the 200's. EMS gave 6mg  of adenosine and it did not convert her. EMS gave another 12mg  of adenosine and HR was 109.   Pt states she has a hx of hyperthyroidism. Pt states she has felt palpitations before. Pt states she does not have any significant cardiac hx.

## 2022-01-31 ENCOUNTER — Ambulatory Visit: Payer: BC Managed Care – PPO | Admitting: Dermatology

## 2022-02-04 NOTE — Progress Notes (Signed)
Cardiology Office Note  Date:  02/05/2022   ID:  Antonietta, [place] Oct 14, 1976, MRN [id number]  PCP:  [place]   Chief Complaint  Patient presents with   New Patient (Initial Visit)    Patient was at [facility] ER on 12/27/2021 having palpitations and SVT spells. Medications reviewed by the patient verbally.     HPI:  Ms. Linda Michael is a 46 year old woman with history of hyperlipidemia,  hypothyroidism, and  anxiety  SVT dating back to 2001,  Seen in the emergency room December 27, 2021 for palpitations, SVT Who presents by referral from Dr. Carrie Mew  for consultation of her SVT  Recent discussion concerning paroxysmal tachycardia/SVT Long history of short runs tachycardia typically alleviated by laying flat on the ground, breath-holds  1 severe episode, she was driving, when she developed tachycardia  Cautiously drove herself to a local fire station, Noted to have elevated heart rate EMS was called, EKG confirming narrow complex tachycardia rate 220 bpm : given 6 mg of adenosine followed by 12 mg of adenosine for SVT, arrhythmia seem to resolve following 12 mg dose.   felt heavy in her chest with the sensation of her heart racing, denies any difficulty breathing.   Was evaluated in the emergency room Troponin 298, felt to be rate /rhythm related  Reports long hx of paroxysmal tachycardia Typically tachycardia will last <60 sec Most recent episode was the most severe, lasting the longest Was not able to lay flat on the ground which typically works to help break the rhythm in the past as she was in her car  EKG personally reviewed by myself on todays visit Normal sinus rhythm no significant ST-T wave changes  EKGs from EMTs reviewed under media file showing SVT rate 220 bpm   PMH:   has a past medical history of Anxiety, Dysplastic nevus (10/02/2021), and Thyroid disease.  PSH:    Past Surgical History:  Procedure Laterality Date   CESAREAN SECTION       Current Outpatient Medications  Medication Sig Dispense Refill   levothyroxine (SYNTHROID) 50 MCG tablet Take 50 mcg by mouth daily before breakfast.     rosuvastatin (CRESTOR) 10 MG tablet Take 1 tablet by mouth daily.     No current facility-administered medications for this visit.     Allergies:   Sulfa antibiotics   Social History:  The patient  reports that she quit smoking about 8 years ago. Her smoking use included cigarettes. She started smoking about 27 years ago. She has never used smokeless tobacco. She reports that she does not drink alcohol and does not use drugs.   Family History:   family history includes Alcohol abuse in her mother; Anxiety disorder in her father and mother; COPD in her mother; Cervical polyp in her mother; Depression in her father and mother; Drug abuse in her father and mother; Fibromyalgia in her mother; Hypertension in her father; Thyroid disease in her mother.    Review of Systems: Review of Systems  Constitutional: Negative.   HENT: Negative.    Respiratory: Negative.    Cardiovascular:  Positive for palpitations.       Paroxysmal tachycardia  Gastrointestinal: Negative.   Musculoskeletal: Negative.   Neurological: Negative.   Psychiatric/Behavioral: Negative.    All other systems reviewed and are negative.   PHYSICAL EXAM: VS:  BP 110/80 (BP Location: Right Arm, Patient Position: Sitting, Cuff Size: Normal)    Pulse 72    Ht 5\' 2"  (1.575 m)  Wt 156 lb 8 oz (71 kg)    SpO2 98%    BMI 28.62 kg/m  , BMI Body mass index is 28.62 kg/m. GEN: Well nourished, well developed, in no acute distress HEENT: normal Neck: no JVD, carotid bruits, or masses Cardiac: RRR; no murmurs, rubs, or gallops,no edema  Respiratory:  clear to auscultation bilaterally, normal work of breathing GI: soft, nontender, nondistended, + BS MS: no deformity or atrophy Skin: warm and dry, no rash Neuro:  Strength and sensation are intact Psych: euthymic mood,  full affect   Recent Labs: 12/27/2021: BUN 17; Creatinine, Ser 0.62; Hemoglobin 15.2; Platelets 283; Potassium 3.3; Sodium 135; TSH 1.987    Lipid Panel No results found for: CHOL, HDL, LDLCALC, TRIG    Wt Readings from Last 3 Encounters:  02/05/22 156 lb 8 oz (71 kg)  12/27/21 160 lb (72.6 kg)     ASSESSMENT AND PLAN:  Problem List Items Addressed This Visit       Cardiology Problems   HLD (hyperlipidemia)   Relevant Medications   rosuvastatin (CRESTOR) 10 MG tablet   Other Visit Diagnoses     SVT (supraventricular tachycardia) (HCC)    -  Primary   Relevant Medications   rosuvastatin (CRESTOR) 10 MG tablet      SVT Documented on EKG under media file, EMS gave adenosine 6 followed by 12, broke to normal sinus rhythm Was very symptomatic, Longstanding history of SVT but typically lasting short periods of time able to make it break laying flat on the ground doing Valsalva technique/maneuver -This particular episode was driving, not able to lay back, children in the car, tachyarrhythmia persisted until it broke with adenosine given by EMS -Echocardiogram ordered to rule out structural heart disease -Recommendation made to discuss with EP, consider ablation given her young age and symptoms.  By her description, likely had hypotension in the setting of tach arrhythmia -Discussed various treatment options including beta-blockade, calcium channel blockers, antiarrhythmics Blood pressure low, will hold off her medications at this time.    Total encounter time more than 60 minutes  Greater than 50% was spent in counseling and coordination of care with the patient  Patient seen in consultation for Dr. Carrie Mew and to be referred back to her office for ongoing care of the issues detailed above  Signed, Esmond Plants, M.D., Ph.D. [place], [place]

## 2022-02-05 ENCOUNTER — Encounter: Payer: Self-pay | Admitting: Cardiovascular Disease

## 2022-02-05 ENCOUNTER — Other Ambulatory Visit: Payer: Self-pay

## 2022-02-05 ENCOUNTER — Ambulatory Visit (INDEPENDENT_AMBULATORY_CARE_PROVIDER_SITE_OTHER): Payer: BC Managed Care – PPO | Admitting: Cardiovascular Disease

## 2022-02-05 VITALS — BP 110/80 | HR 72 | Ht 62.0 in | Wt 156.5 lb

## 2022-02-05 DIAGNOSIS — E782 Mixed hyperlipidemia: Secondary | ICD-10-CM

## 2022-02-05 DIAGNOSIS — I471 Supraventricular tachycardia: Secondary | ICD-10-CM

## 2022-02-05 NOTE — Patient Instructions (Addendum)
Medication Instructions:  No changes  If you need a refill on your cardiac medications before your next appointment, please call your pharmacy.     Lab work: No new labs needed    Testing/Procedures:  1) Electrophysiology Referral: - You have been referred to : Cardiac Electrophysiology- for consideration of SVT ablation  - Dr. Quentin Ore ([place]/ [place])/ Dr. Lovena Le ([place])/ Dr. Curt Bears [facility])-- 1st available   2) Echocardiogram: - Your physician has requested that you have an echocardiogram. Echocardiography is a painless test that uses sound waves to create images of your heart. It provides your doctor with information about the size and shape of your heart and how well your hearts chambers and valves are working. This procedure takes approximately one hour. There are no restrictions for this procedure.There is a possibility that an IV may need to be started during your test to inject an image enhancing agent. This is done to obtain more optimal pictures of your heart. Therefore we ask that you do at least drink some water prior to coming in to hydrate your veins.    3) Info on CT coronary calcium score has been given to you: - please contact the office if you decide you would like to proceed with this and we and place an order for you to have this done  - $99 out of pocket cost at the time the test  - [address], [place] [address] [phone]   Follow-Up: At [facility], you and your health needs are our priority.  As part of our continuing mission to provide you with exceptional heart care, we have created designated Provider Care Teams.  These Care Teams include your primary Cardiologist (physician) and Advanced Practice Providers (APPs -  Physician Assistants and Nurse Practitioners) who all work together to provide you with the care you need, when you need it.  You will need a follow up  appointment as needed with Dr. Rockey Situ  Providers on your designated Care Team:   Murray Hodgkins, NP Christell Faith, PA-C Cadence Kathlen Mody, [place]  COVID-19 Vaccine Information can be found at: [email or web address].[email or web address].uk For questions related to vaccine distribution or appointments, please email [email or web address] or call [phone].    Echocardiogram An echocardiogram is a test that uses sound waves (ultrasound) to produce images of the heart. Images from an echocardiogram can provide important information about: Heart size and shape. The size and thickness and movement of your heart's walls. Heart muscle function and strength. Heart valve function or if you have stenosis. Stenosis is when the heart valves are too narrow. If blood is flowing backward through the heart valves (regurgitation). A tumor or infectious growth around the heart valves. Areas of heart muscle that are not working well because of poor blood flow or injury from a heart attack. Aneurysm detection. An aneurysm is a weak or damaged part of an artery wall. The wall bulges out from the normal force of blood pumping through the body. Tell a health care provider about: Any allergies you have. All medicines you are taking, including vitamins, herbs, eye drops, creams, and over-the-counter medicines. Any blood disorders you have. Any surgeries you have had. Any medical conditions you have. Whether you are pregnant or may be pregnant. What are the risks? Generally, this is a safe test. However, problems may occur, including an allergic reaction to dye (contrast) that may be used during the test. What happens before the test? No specific preparation is needed. You  may eat and drink normally. What happens during the test?  You will take off your clothes from the waist up and put on a hospital gown. Electrodes or electrocardiogram (ECG)patches may be placed on your chest.  The electrodes or patches are then connected to a device that monitors your heart rate and rhythm. You will lie down on a table for an ultrasound exam. A gel will be applied to your chest to help sound waves pass through your skin. A handheld device, called a transducer, will be pressed against your chest and moved over your heart. The transducer produces sound waves that travel to your heart and bounce back (or "echo" back) to the transducer. These sound waves will be captured in real-time and changed into images of your heart that can be viewed on a video monitor. The images will be recorded on a computer and reviewed by your health care provider. You may be asked to change positions or hold your breath for a short time. This makes it easier to get different views or better views of your heart. In some cases, you may receive contrast through an IV in one of your veins. This can improve the quality of the pictures from your heart. The procedure may vary among health care providers and hospitals. What can I expect after the test? You may return to your normal, everyday life, including diet, activities, and medicines, unless your health care provider tells you not to do that. Follow these instructions at home: It is up to you to get the results of your test. Ask your health care provider, or the department that is doing the test, when your results will be ready. Keep all follow-up visits. This is important. Summary An echocardiogram is a test that uses sound waves (ultrasound) to produce images of the heart. Images from an echocardiogram can provide important information about the size and shape of your heart, heart muscle function, heart valve function, and other possible heart problems. You do not need to do anything to prepare before this test. You may eat and drink normally. After the echocardiogram is completed, you may return to your normal, everyday life, unless your health care provider tells  you not to do that. This information is not intended to replace advice given to you by your health care provider. Make sure you discuss any questions you have with your health care provider. Document Revised: 08/29/2021 Document Reviewed: 08/08/2020 Elsevier Patient Education  2022 [employer].

## 2022-02-28 ENCOUNTER — Other Ambulatory Visit: Payer: Self-pay

## 2022-02-28 ENCOUNTER — Ambulatory Visit (INDEPENDENT_AMBULATORY_CARE_PROVIDER_SITE_OTHER): Payer: BC Managed Care – PPO

## 2022-02-28 DIAGNOSIS — I471 Supraventricular tachycardia: Secondary | ICD-10-CM | POA: Diagnosis not present

## 2022-03-01 LAB — ECHOCARDIOGRAM COMPLETE
Area-P 1/2: 3.85 cm2
Calc EF: 55.8 %
S' Lateral: 2.8 cm
Single Plane A2C EF: 56.4 %
Single Plane A4C EF: 56.7 %

## 2022-03-04 ENCOUNTER — Telehealth: Payer: Self-pay

## 2022-03-04 NOTE — Telephone Encounter (Signed)
Able to reach pt regarding her recent ECHO Dr. Rockey Situ had a chance to review her results and advised  ? ?"Echocardiogram  ?Normal LV function  ?Normal RV function  ?No significant valvular heart disease " ? ?Linda Michael very thankful for the phone call of her results, all questions and concerns were address with nothing further at this time. Will see at next schedule f/u appt.  ? ? ? ?

## 2022-03-13 ENCOUNTER — Ambulatory Visit (INDEPENDENT_AMBULATORY_CARE_PROVIDER_SITE_OTHER): Payer: BC Managed Care – PPO | Admitting: Cardiology

## 2022-03-13 ENCOUNTER — Encounter: Payer: Self-pay | Admitting: Cardiology

## 2022-03-13 ENCOUNTER — Other Ambulatory Visit: Payer: Self-pay

## 2022-03-13 VITALS — BP 124/90 | HR 76 | Ht 62.0 in | Wt 161.5 lb

## 2022-03-13 DIAGNOSIS — I471 Supraventricular tachycardia: Secondary | ICD-10-CM

## 2022-03-13 NOTE — Patient Instructions (Signed)
Medication Instructions:  ?- Your physician recommends that you continue on your current medications as directed. Please refer to the Current Medication list given to you today. ? ?*If you need a refill on your cardiac medications before your next appointment, please call your pharmacy* ? ? ?Lab Work: ?- none ordered ? ?If you have labs (blood work) drawn today and your tests are completely normal, you will receive your results only by: ?MyChart Message (if you have MyChart) OR ?A paper copy in the mail ?If you have any lab test that is abnormal or we need to change your treatment, we will call you to review the results. ? ? ?Testing/Procedures: ? ?1) Electrophysiology Study: ?- Your physician has recommended that you have an EP Study. This test is used to assess serious arrhythmias (irregular heartbeats). During an Electro-physiology Study (EPS), a thin, flexible wire is passed through a vein in your groin (upper thigh) or neck up to the heart. The wire records the heart?s electrical signals. Your doctor uses the wire to electrically stimulate your heart and trigger an arrhythmic. This allows the doctor to see whether an antiarrhythmia medicine can help manage the problem or if further procedures are necessary (i.e., ablation/ICD). Radiofrequency ablation, a procedure used to fix some types of arrthythmia, may be done during an EPS. This is done in the hospital and often requires an overnight stay.  ? ? ?2) SVT Ablation: ?- Your physician has requested that you have cardiac CT. Cardiac computed tomography (CT) is a painless test that uses an x-ray machine to take clear, detailed pictures of your heart.  ? ? ?Currently available summer dates (which are subject to change): ?Monday 6/19 ?Thursday 6/22 ?Monday 6/26 ?Friday 6/30 ?Monday 7/3 ?Thursday 7/20 ?Friday 7/21 ?Monday 7/24 ?Tuesday 7/25 ?Friday 8/4 ? ? ? ?Follow-Up: ?At [facility], you and your health needs are our priority.  As part of our continuing  mission to provide you with exceptional heart care, we have created designated Provider Care Teams.  These Care Teams include your primary Cardiologist (physician) and Advanced Practice Providers (APPs -  Physician Assistants and Nurse Practitioners) who all work together to provide you with the care you need, when you need it. ? ?We recommend signing up for the patient portal called "MyChart".  Sign up information is provided on this After Visit Summary.  MyChart is used to connect with patients for Virtual Visits (Telemedicine).  Patients are able to view lab/test results, encounter notes, upcoming appointments, etc.  Non-urgent messages can be sent to your provider as well.   ?To learn more about what you can do with MyChart, go to [email or web address].   ? ?Your next appointment:   ?Pending ablation date if you decide to proceed  ? ?The format for your next appointment:   ?In Person ? ?Provider:   ?Lars Mage, MD  ? ? ?Other Instructions ? ?Electrophysiology Study ?An electrophysiology (EP) study is a heart test to check how well the heart's electrical conduction system is working. The electrical conduction system uses electrical signals to make the heart beat. In this study, thin, flexible tubes (catheters) are placed in a large vein in your groin, arm, neck, or chest. ?A person may need this test if he or she has: ?Dizziness or fainting. ?An abnormal heart rhythm (arrhythmia), such as: ?A fast heartbeat (tachycardia). ?A slow heartbeat (bradycardia). ?An irregular heartbeat, such as atrial fibrillation. ?Tell a health care provider about: ?Any allergies you have. ?All medicines you are taking, including vitamins,  herbs, eye drops, creams, and over-the-counter medicines. ?Any problems you or family members have had with anesthetic medicines. ?Any blood disorders you have. ?Any surgeries you have had. ?Any medical conditions you have or have had. ?Whether you are pregnant or may be pregnant. ?What are  the risks? ?Generally, this is a safe procedure. However, problems may occur, including: ?Tachycardia that does not go away. ?Bleeding or bruising around the insertion site. ?Infection. ?Temporary or permanent problems of the heart rhythm. ?Temporary changes in blood pressure. ?Puncture (perforation) of the heart wall or a blood vessel. This can cause bleeding and pressure between the heart and the sac that surrounds it (cardiac tamponade). ?Severe heart problems, such as: ?Cardiac arrest. This is when the heart stops beating. ?Life-threatening arrhythmia. ?Allergic reactions to medicines or dyes. ?Damage to nearby structures or organs. ?What happens before the procedure? ?Staying hydrated ?Follow instructions from your health care provider about hydration, which may include: ?Up to 2 hours before the procedure - you may continue to drink clear liquids, such as water, clear fruit juice, black coffee, and plain tea. ? ?Eating and drinking restrictions ?Follow instructions from your health care provider about eating and drinking, which may include: ?8 hours before the procedure - stop eating heavy meals or foods, such as meat, fried foods, or fatty foods. ?6 hours before the procedure - stop eating light meals or foods, such as toast or cereal. ?6 hours before the procedure - stop drinking milk or drinks that contain milk. ?2 hours before the procedure - stop drinking clear liquids. ?Medicines ?Ask your health care provider about: ?Changing or stopping your regular medicines. This is especially important if you are taking diabetes medicines or blood thinners. ?Taking medicines such as aspirin and ibuprofen. These medicines can thin your blood. Do not take these medicines unless your health care provider tells you to take them. ?Taking over-the-counter medicines, vitamins, herbs, and supplements. ?Surgery safety ?Ask your health care provider: ?How your surgery site will be marked. ?What steps will be taken to help  prevent infection. These may include: ?Removing hair at the surgery site. ?Washing skin with a germ-killing soap. ?Taking antibiotic medicine. ?General instructions ?Do not use any products that contain nicotine or tobacco for at least 4 weeks before the procedure. These products include cigarettes, e-cigarettes, and chewing tobacco. If you need help quitting, ask your health care provider. ?Plan to have someone take you home from the hospital or clinic. ?If you will be going home right after the procedure, plan to have someone with you for 24 hours. ?What happens during the procedure? ? ?An IV will be inserted into one of your veins. ?You will be given one or more of the following: ?A medicine to help you relax (sedative). ?A medicine to numb the area (local anesthetic). ?A medicine to make you fall asleep (general anesthetic). ?Catheters with an electrode tip will be inserted into a large vein. These electrode tips can measure the heart's electrical activity. They can also use electrical signals to change the heart rhythm. ?The catheters will be guided to the heart using a type of X-ray machine (fluoroscopy). Once the catheters are in the heart, they will evaluate the electrical activity of your heart. ?If you are awake during the EP study, you may feel dizzy or light-headed. Your heart rate may temporarily increase, or you may feel your heart beating hard. Tell your health care provider if you experience these things during the EP study: ?You feel dizzy or nauseous. ?You  have chest pain or pressure. ?The catheters will be removed. ?Firm pressure will be applied to the insertion site to prevent bleeding. ?A bandage (dressing) may be applied over the insertion site. ?The procedure may vary among health care providers and hospitals. ?What happens after the procedure? ? ?Your blood pressure, heart rate, breathing rate, and blood oxygen level will be monitored until you leave the hospital or clinic. ?If you were given  a sedative during the procedure, it can affect you for several hours. Do not drive or operate machinery until your health care provider says that it is safe. ?You will need to lie flat for a few hours or as tol
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
## 2022-08-22 ENCOUNTER — Ambulatory Visit: Payer: BC Managed Care – PPO | Admitting: Dermatology

## 2022-09-17 ENCOUNTER — Other Ambulatory Visit: Payer: Self-pay | Admitting: Physician Assistant

## 2022-09-17 DIAGNOSIS — Z1231 Encounter for screening mammogram for malignant neoplasm of breast: Secondary | ICD-10-CM

## 2022-11-25 ENCOUNTER — Telehealth: Payer: Self-pay | Admitting: Cardiology

## 2022-11-25 NOTE — Telephone Encounter (Signed)
Hey Dr. Quentin Ore,   Before we call this patient- she has not been seen since March 2023. Would you want her to come again prior to scheduling any procedure?   Thanks!

## 2022-11-25 NOTE — Telephone Encounter (Signed)
Pt would like a callback regarding scheduling Ablation that was discussed at previous visit. Please advise

## 2022-11-27 ENCOUNTER — Other Ambulatory Visit: Payer: Self-pay

## 2022-11-27 DIAGNOSIS — I471 Supraventricular tachycardia, unspecified: Secondary | ICD-10-CM

## 2022-11-27 NOTE — Telephone Encounter (Signed)
Linda Epley, MD  Sent: Wed November 27, 2022  9:01 AM  To: Emily Filbert, RN  Cc: Desmond Dike [facility] Triage         Message  You can go ahead and get her scheduled but I would like to see her virtually before the procedure date. Can you put that in?  Lars Mage

## 2022-11-27 NOTE — Telephone Encounter (Signed)
I called and spoke with the patient.  I advised her that Dr. Quentin Ore had reviewed her message and advised it is ok to schedule her SVT ablation, but she will need a virtual visit prior.  Per the patient, she has had more frequent episodes of SVT as of late.  She did have to call EMS around Thanksgiving and she required a dose of adenosine.  She states she is now ready to proceed with her ablation.  I have advised her that I will forward this message to Dr. Mardene Speak EP scheduler, April, to give her some possible dates that her procedure can be done.  She is aware that once a date is scheduled, we will get her an appointment for a virtual visit with Dr. Quentin Ore.  The patient voices understanding and is agreeable.  She was very appreciative of the call back.

## 2022-11-28 NOTE — Telephone Encounter (Signed)
Louretta Shorten, April, [place]  Sent: Wed November 27, 2022 12:12 PM  To: Emily Filbert, RN         Message  Pt has been scheduled for 12/16/22 @ 1:30 for her procedure. [facility]. I will send her instructions via MyChart.  She is aware that you will be calling her to schedule a VV with Dr. Quentin Ore.    Thanks,  April

## 2022-11-28 NOTE — Telephone Encounter (Signed)
I called and spoke with the patient. I have offered her a virtual appointment with Dr. Quentin Ore on: Wed 12/6 at 8:00  Wed 12/13 at 8:00 Wed 12/13 at 1140  Per the patient, she prefers 12/13 at 1140 due to dropping her children off at school at 8:00 am.  She is aware that I will also be sending copy of a consent through her MyChart to please review prior to her appointment.  The patient voices understanding and is agreeable.

## 2022-12-02 ENCOUNTER — Other Ambulatory Visit
Admission: RE | Admit: 2022-12-02 | Discharge: 2022-12-02 | Disposition: A | Discharge status: Home / Self Care | Payer: BC Managed Care – PPO | Attending: Cardiology | Admitting: Cardiology

## 2022-12-02 DIAGNOSIS — I471 Supraventricular tachycardia, unspecified: Secondary | ICD-10-CM | POA: Diagnosis present

## 2022-12-02 LAB — BASIC METABOLIC PANEL
Anion gap: 5 (ref 5–15)
BUN: 18 mg/dL (ref 6–20)
CO2: 26 mmol/L (ref 22–32)
Calcium: 9.1 mg/dL (ref 8.9–10.3)
Chloride: 107 mmol/L (ref 98–111)
Creatinine, Ser: 0.7 mg/dL (ref 0.44–1.00)
GFR, Estimated: >60 mL/min (ref >60)
Glucose, Bld: 100 mg/dL — ABNORMAL HIGH (ref 70–99)
Potassium: 4.2 mmol/L (ref 3.5–5.1)
Sodium: 138 mmol/L (ref 135–145)

## 2022-12-02 LAB — CBC
HCT: 40.1 % (ref 36.0–46.0)
Hemoglobin: 13.3 g/dL (ref 12.0–15.0)
MCH: 30.8 pg (ref 26.0–34.0)
MCHC: 33.2 g/dL (ref 30.0–36.0)
MCV: 92.8 fL (ref 80.0–100.0)
Platelets: 317 10*3/uL (ref 150–400)
RBC: 4.32 MIL/uL (ref 3.87–5.11)
RDW: 12.2 % (ref 11.5–15.5)
WBC: 7.7 10*3/uL (ref 4.0–10.5)
nRBC: 0 % (ref 0.0–0.2)

## 2022-12-11 ENCOUNTER — Ambulatory Visit: Payer: BC Managed Care – PPO | Attending: Cardiology | Admitting: Cardiology

## 2022-12-11 DIAGNOSIS — I471 Supraventricular tachycardia, unspecified: Secondary | ICD-10-CM | POA: Diagnosis not present

## 2022-12-11 NOTE — H&P (View-Only) (Signed)
Virtual Visit via Video Note   Because of Thy H Bicking's co-morbid illnesses, she is at least at moderate risk for complications without adequate follow up.  This format is felt to be most appropriate for this patient at this time.  All issues noted in this document were discussed and addressed.  A limited physical exam was performed with this format.  Please refer to the patient's chart for her consent to telehealth for [facility].       Date:  12/11/2022   ID:  Linda Michael, DOB Apr 18, 1976, MRN [id number] The patient was identified using 2 identifiers.  Patient Location: Home Provider Location: Office/Clinic   PCP:  [place] [facility] Cardiologist:  None Electrophysiologist:  Vickie Epley, MD {  Evaluation Performed:  Follow-Up Visit  Chief Complaint: SVT  History of Present Illness:    Linda Michael is a 46 y.o. female with SVT who I am seeing in follow-up.  I last saw her March 13, 2022 for symptomatic recurrent SVT.  Her SVT is responsive to adenosine.  At her last appointment in March we discussed scheduling a catheter ablation.  She has had several episodes of SVT since I last saw her.  She had one while at a high school football game that was able to be terminated by using vagal maneuvers.  Another when she was at home on Thanksgiving day required EMS to administer adenosine.  One round of adenosine terminated the SVT.  She feels fatigued during the episodes.   Past Medical History:  Diagnosis Date   Anxiety    Dysplastic nevus 10/02/2021   upper back left of midline MODERATE TO SEVERE ATYPIA, excision   Thyroid disease    Past Surgical History:  Procedure Laterality Date   CESAREAN SECTION       No outpatient medications have been marked as taking for the 12/11/22 encounter (Appointment) with Vickie Epley, MD.     Allergies:   Sulfa antibiotics   Social History   Tobacco Use    Smoking status: Former    Types: Cigarettes    Start date: 10/10/1994    Quit date: 08/30/2013    Years since quitting: 9.2   Smokeless tobacco: Never  Vaping Use   Vaping Use: Never used  Substance Use Topics   Alcohol use: No    Alcohol/week: 0.0 standard drinks of alcohol   Drug use: No     Family Hx: The patient's family history includes Alcohol abuse in her mother; Anxiety disorder in her father and mother; COPD in her mother; Cervical polyp in her mother; Depression in her father and mother; Drug abuse in her father and mother; Fibromyalgia in her mother; Hypertension in her father; Thyroid disease in her mother.  ROS:   Please see the history of present illness.     All other systems reviewed and are negative.   Prior CV studies:   The following studies were reviewed today:    Labs/Other Tests and Data Reviewed:    Recent Labs: 12/27/2021: TSH 1.987 12/02/2022: BUN 18; Creatinine, Ser 0.70; Hemoglobin 13.3; Platelets 317; Potassium 4.2; Sodium 138   Recent Lipid Panel No results found for: "CHOL", "TRIG", "HDL", "CHOLHDL", "LDLCALC", "LDLDIRECT"  Wt Readings from Last 3 Encounters:  03/13/22 161 lb 8 oz (73.3 kg)  02/05/22 156 lb 8 oz (71 kg)  12/27/21 160 lb (72.6 kg)         ASSESSMENT & PLAN:    #  SVT Symptomatic.  Recurrent.  Has required adenosine in the past.  I discussed treatment options with the patient during today's visit including medical therapy and EP study with ablation.  Given the recurrent nature of her SVT and young age, I have recommended EP study with ablation.  I discussed the procedure in detail including the risk, recovery and likelihood of success.  She would like some time to think about the options before making a final decision which I think is very reasonable.  She will let us know if she would like to proceed with scheduling.  Therapeutic strategies for supraventricular tachycardia including medicine and ablation were discussed in  detail with the patient today. Risk, benefits, and alternatives to EP study and radiofrequency ablation were also discussed in detail today. These risks include but are not limited to stroke, bleeding, vascular damage, tamponade, perforation, damage to the heart and other structures, AV block requiring pacemaker, worsening renal   function, and death. The patient understands these risks.        Time:   Today, I have spent 15 minutes with the patient with telehealth technology discussing the above problems.     Medication Adjustments/Labs and Tests Ordered: Current medicines are reviewed at length with the patient today.  Concerns regarding medicines are outlined above.   Tests Ordered: No orders of the defined types were placed in this encounter.   Medication Changes: No orders of the defined types were placed in this encounter.   Signed, Vickie Epley, MD  12/11/2022 7:38 AM    [place]

## 2022-12-11 NOTE — Progress Notes (Signed)
Virtual Visit via Video Note   Because of Linda Michael's co-morbid illnesses, she is at least at moderate risk for complications without adequate follow up.  This format is felt to be most appropriate for this patient at this time.  All issues noted in this document were discussed and addressed.  A limited physical exam was performed with this format.  Please refer to the patient's chart for her consent to telehealth for [facility].       Date:  12/11/2022   ID:  Linda Michael, DOB 20-Aug-1976, MRN [id number] The patient was identified using 2 identifiers.  Patient Location: Home Provider Location: Office/Clinic   PCP:  [facility] Providers Cardiologist:  None Electrophysiologist:  Vickie Epley, MD {  Evaluation Performed:  Follow-Up Visit  Chief Complaint: SVT  History of Present Illness:    Linda Michael is a 46 y.o. female with SVT who I am seeing in follow-up.  I last saw her March 13, 2022 for symptomatic recurrent SVT.  Her SVT is responsive to adenosine.  At her last appointment in March we discussed scheduling a catheter ablation.  She has had several episodes of SVT since I last saw her.  She had one while at a high school football game that was able to be terminated by using vagal maneuvers.  Another when she was at home on Thanksgiving day required EMS to administer adenosine.  One round of adenosine terminated the SVT.  She feels fatigued during the episodes.   Past Medical History:  Diagnosis Date   Anxiety    Dysplastic nevus 10/02/2021   upper back left of midline MODERATE TO SEVERE ATYPIA, excision   Thyroid disease    Past Surgical History:  Procedure Laterality Date   CESAREAN SECTION       No outpatient medications have been marked as taking for the 12/11/22 encounter (Appointment) with Vickie Epley, MD.     Allergies:   Sulfa antibiotics   Social History   Tobacco Use    Smoking status: Former    Types: Cigarettes    Start date: 10/10/1994    Quit date: 08/30/2013    Years since quitting: 9.2   Smokeless tobacco: Never  Vaping Use   Vaping Use: Never used  Substance Use Topics   Alcohol use: No    Alcohol/week: 0.0 standard drinks of alcohol   Drug use: No     Family Hx: The patient's family history includes Alcohol abuse in her mother; Anxiety disorder in her father and mother; COPD in her mother; Cervical polyp in her mother; Depression in her father and mother; Drug abuse in her father and mother; Fibromyalgia in her mother; Hypertension in her father; Thyroid disease in her mother.  ROS:   Please see the history of present illness.     All other systems reviewed and are negative.   Prior CV studies:   The following studies were reviewed today:    Labs/Other Tests and Data Reviewed:    Recent Labs: 12/27/2021: TSH 1.987 12/02/2022: BUN 18; Creatinine, Ser 0.70; Hemoglobin 13.3; Platelets 317; Potassium 4.2; Sodium 138   Recent Lipid Panel No results found for: "CHOL", "TRIG", "HDL", "CHOLHDL", "LDLCALC", "LDLDIRECT"  Wt Readings from Last 3 Encounters:  03/13/22 161 lb 8 oz (73.3 kg)  02/05/22 156 lb 8 oz (71 kg)  12/27/21 160 lb (72.6 kg)         ASSESSMENT & PLAN:    #  SVT Symptomatic.  Recurrent.  Has required adenosine in the past.  I discussed treatment options with the patient during today's visit including medical therapy and EP study with ablation.  Given the recurrent nature of her SVT and young age, I have recommended EP study with ablation.  I discussed the procedure in detail including the risk, recovery and likelihood of success.  She would like some time to think about the options before making a final decision which I think is very reasonable.  She will let us know if she would like to proceed with scheduling.  Therapeutic strategies for supraventricular tachycardia including medicine and ablation were discussed in  detail with the patient today. Risk, benefits, and alternatives to EP study and radiofrequency ablation were also discussed in detail today. These risks include but are not limited to stroke, bleeding, vascular damage, tamponade, perforation, damage to the heart and other structures, AV block requiring pacemaker, worsening renal   function, and death. The patient understands these risks.        Time:   Today, I have spent 15 minutes with the patient with telehealth technology discussing the above problems.     Medication Adjustments/Labs and Tests Ordered: Current medicines are reviewed at length with the patient today.  Concerns regarding medicines are outlined above.   Tests Ordered: No orders of the defined types were placed in this encounter.   Medication Changes: No orders of the defined types were placed in this encounter.   Signed, Vickie Epley, MD  12/11/2022 7:38 AM    [place]

## 2022-12-11 NOTE — Patient Instructions (Addendum)
Medication Instructions:  Your physician recommends that you continue on your current medications as directed. Please refer to the Current Medication list given to you today.  *If you need a refill on your cardiac medications before your next appointment, please call your pharmacy*  Follow-Up: At [facility], you and your health needs are our priority.  As part of our continuing mission to provide you with exceptional heart care, we have created designated Provider Care Teams.  These Care Teams include your primary Cardiologist (physician) and Advanced Practice Providers (APPs -  Physician Assistants and Nurse Practitioners) who all work together to provide you with the care you need, when you need it.  Your next appointment:   01/15/23 with Dr. Quentin Ore  Important Information About Sugar

## 2022-12-13 NOTE — Pre-Procedure Instructions (Signed)
Instructed patient on the following items: Arrival time 1130 Nothing to eat or drink after midnight No meds AM of procedure Responsible person to drive you home and stay with you for 24 hrs   

## 2022-12-16 ENCOUNTER — Other Ambulatory Visit: Payer: Self-pay

## 2022-12-16 ENCOUNTER — Ambulatory Visit (HOSPITAL_COMMUNITY)
Admission: RE | Admit: 2022-12-16 | Discharge: 2022-12-16 | Disposition: A | Discharge status: Home / Self Care | Payer: BC Managed Care – PPO | Attending: Cardiology | Admitting: Cardiology

## 2022-12-16 ENCOUNTER — Ambulatory Visit (HOSPITAL_COMMUNITY): Payer: BC Managed Care – PPO | Admitting: Certified Registered Nurse Anesthetist

## 2022-12-16 ENCOUNTER — Encounter (HOSPITAL_COMMUNITY)
Admission: RE | Disposition: A | Discharge status: Home / Self Care | Payer: Self-pay | Source: Home / Self Care | Attending: Cardiology

## 2022-12-16 DIAGNOSIS — I4719 Other supraventricular tachycardia: Secondary | ICD-10-CM | POA: Insufficient documentation

## 2022-12-16 DIAGNOSIS — I471 Supraventricular tachycardia, unspecified: Secondary | ICD-10-CM | POA: Diagnosis not present

## 2022-12-16 DIAGNOSIS — Z01818 Encounter for other preprocedural examination: Secondary | ICD-10-CM

## 2022-12-16 HISTORY — PX: SVT ABLATION: EP1225

## 2022-12-16 LAB — PREGNANCY, URINE: Preg Test, Ur: NEGATIVE

## 2022-12-16 SURGERY — SVT ABLATION
Anesthesia: Monitor Anesthesia Care

## 2022-12-16 MED ORDER — SODIUM CHLORIDE 0.9 % IV SOLN: INTRAVENOUS | Status: DC

## 2022-12-16 MED ORDER — ONDANSETRON HCL 4 MG/2ML IJ SOLN: 4.0000 mg | Freq: Four times a day (QID) | INTRAMUSCULAR | Status: DC | PRN

## 2022-12-16 MED ORDER — SODIUM CHLORIDE 0.9% FLUSH: 3.0000 mL | INTRAVENOUS | Status: DC | PRN

## 2022-12-16 MED ORDER — ISOPROTERENOL HCL 0.2 MG/ML IJ SOLN
INTRAVENOUS | Status: DC | PRN
  Administered 2022-12-16: 2 ug/min via INTRAVENOUS

## 2022-12-16 MED ORDER — PHENYLEPHRINE 80 MCG/ML (10ML) SYRINGE FOR IV PUSH (FOR BLOOD PRESSURE SUPPORT)
PREFILLED_SYRINGE | INTRAVENOUS | Status: DC | PRN
  Administered 2022-12-16 (×4): 40 ug via INTRAVENOUS
  Administered 2022-12-16 (×2): 80 ug via INTRAVENOUS

## 2022-12-16 MED ORDER — HEPARIN (PORCINE) IN NACL 1000-0.9 UT/500ML-% IV SOLN
INTRAVENOUS | Status: DC | PRN
  Administered 2022-12-16: 500 mL

## 2022-12-16 MED ORDER — BUPIVACAINE HCL (PF) 0.25 % IJ SOLN
INTRAMUSCULAR | Status: AC
  Filled 2022-12-16: qty 30

## 2022-12-16 MED ORDER — MIDAZOLAM HCL 2 MG/2ML IJ SOLN
INTRAMUSCULAR | Status: DC | PRN
  Administered 2022-12-16: 2 mg via INTRAVENOUS

## 2022-12-16 MED ORDER — ONDANSETRON HCL 4 MG/2ML IJ SOLN
INTRAMUSCULAR | Status: DC | PRN
  Administered 2022-12-16: 4 mg via INTRAVENOUS

## 2022-12-16 MED ORDER — ACETAMINOPHEN 500 MG PO TABS: 1000.0000 mg | ORAL_TABLET | Freq: Once | ORAL | Status: AC

## 2022-12-16 MED ORDER — PROPOFOL 500 MG/50ML IV EMUL
INTRAVENOUS | Status: DC | PRN
  Administered 2022-12-16: 100 ug/kg/min via INTRAVENOUS
  Administered 2022-12-16: 125 ug/kg/min via INTRAVENOUS

## 2022-12-16 MED ORDER — ACETAMINOPHEN 500 MG PO TABS
ORAL_TABLET | ORAL | Status: AC
  Administered 2022-12-16: 1000 mg via ORAL
  Filled 2022-12-16: qty 2

## 2022-12-16 MED ORDER — HEPARIN SODIUM (PORCINE) 1000 UNIT/ML IJ SOLN
INTRAMUSCULAR | Status: DC | PRN
  Administered 2022-12-16: 1000 [IU] via INTRAVENOUS

## 2022-12-16 MED ORDER — SODIUM CHLORIDE 0.9 % IV SOLN: 250.0000 mL | INTRAVENOUS | Status: DC | PRN

## 2022-12-16 MED ORDER — HEPARIN (PORCINE) IN NACL 1000-0.9 UT/500ML-% IV SOLN
INTRAVENOUS | Status: AC
  Filled 2022-12-16: qty 500

## 2022-12-16 MED ORDER — SODIUM CHLORIDE 0.9% FLUSH: 3.0000 mL | Freq: Two times a day (BID) | INTRAVENOUS | Status: DC

## 2022-12-16 MED ORDER — ACETAMINOPHEN 325 MG PO TABS: 650.0000 mg | ORAL_TABLET | ORAL | Status: DC | PRN

## 2022-12-16 MED ORDER — LIDOCAINE 2% (20 MG/ML) 5 ML SYRINGE
INTRAMUSCULAR | Status: DC | PRN
  Administered 2022-12-16: 40 mg via INTRAVENOUS

## 2022-12-16 MED ORDER — BUPIVACAINE HCL (PF) 0.25 % IJ SOLN
INTRAMUSCULAR | Status: DC | PRN
  Administered 2022-12-16: 30 mL

## 2022-12-16 MED ORDER — PROPOFOL 10 MG/ML IV BOLUS
INTRAVENOUS | Status: DC | PRN
  Administered 2022-12-16 (×2): 20 mg via INTRAVENOUS

## 2022-12-16 MED ORDER — ISOPROTERENOL HCL 0.2 MG/ML IJ SOLN
INTRAMUSCULAR | Status: AC
  Filled 2022-12-16: qty 5

## 2022-12-16 MED ORDER — HEPARIN SODIUM (PORCINE) 1000 UNIT/ML IJ SOLN
INTRAMUSCULAR | Status: AC
  Filled 2022-12-16: qty 10

## 2022-12-16 SURGICAL SUPPLY — 13 items
CATH CRD2 QUAD 6FR REP (CATHETERS) IMPLANT
CATH DECANAV F CURVE (CATHETERS) IMPLANT
CATH JOSEPH QUAD ALLRED 6F REP (CATHETERS) IMPLANT
CATH SMTCH THERMOCOOL SF FJ (CATHETERS) IMPLANT
CLOSURE PERCLOSE PROSTYLE (VASCULAR PRODUCTS) IMPLANT
PACK EP LATEX FREE (CUSTOM PROCEDURE TRAY) ×1
PACK EP LF (CUSTOM PROCEDURE TRAY) ×1 IMPLANT
PAD DEFIB RADIO PHYSIO CONN (PAD) ×1 IMPLANT
PATCH CARTO3 (PAD) IMPLANT
SHEATH CARTO VIZIGO SM CVD (SHEATH) IMPLANT
SHEATH PINNACLE 7F 10CM (SHEATH) IMPLANT
SHEATH PINNACLE 8F 10CM (SHEATH) IMPLANT
TUBING SMART ABLATE COOLFLOW (TUBING) IMPLANT

## 2022-12-16 NOTE — Discharge Instructions (Addendum)
Post procedure care instructions No driving for 4 days. No lifting over 5 lbs for 1 week. No vigorous or sexual activity for 1 week. You may return to work/your usual activities on 12/24/22. Keep procedure site clean & dry. If you notice increased pain, swelling, bleeding or pus, call/return!  You may shower after 24 hours, but no soaking in baths/hot tubs/pools for 1 week.    Cardiac Ablation, Care After  This sheet gives you information about how to care for yourself after your procedure. Your health care provider may also give you more specific instructions. If you have problems or questions, contact your health care provider. What can I expect after the procedure? After the procedure, it is common to have: Bruising around your puncture site. Tenderness around your puncture site. Skipped heartbeats. If you had an atrial fibrillation ablation, you may have atrial fibrillation during the first several months after your procedure.  Tiredness (fatigue).  Follow these instructions at home: Puncture site care  Follow instructions from your health care provider about how to take care of your puncture site. Make sure you: If present, leave stitches (sutures), skin glue, or adhesive strips in place. These skin closures may need to stay in place for up to 2 weeks. If adhesive strip edges start to loosen and curl up, you may trim the loose edges. Do not remove adhesive strips completely unless your health care provider tells you to do that. If a large square bandage is present, this may be removed 24 hours after surgery.  Check your puncture site every day for signs of infection. Check for: Redness, swelling, or pain. Fluid or blood. If your puncture site starts to bleed, lie down on your back, apply firm pressure to the area, and contact your health care provider. Warmth. Pus or a bad smell. A pea or small marble sized lump at the site is normal and can take up to three months to resolve.   Driving Do not drive for at least 4 days after your procedure or however long your health care provider recommends. (Do not resume driving if you have previously been instructed not to drive for other health reasons.) Do not drive or use heavy machinery while taking prescription pain medicine. Activity Avoid activities that take a lot of effort for at least 7 days after your procedure. Do not lift anything that is heavier than 5 lb (4.5 kg) for one week.  No sexual activity for 1 week.  Return to your normal activities as told by your health care provider. Ask your health care provider what activities are safe for you. General instructions Take over-the-counter and prescription medicines only as told by your health care provider. Do not use any products that contain nicotine or tobacco, such as cigarettes and e-cigarettes. If you need help quitting, ask your health care provider. You may shower after 24 hours, but Do not take baths, swim, or use a hot tub for 1 week.  Do not drink alcohol for 24 hours after your procedure. Keep all follow-up visits as told by your health care provider. This is important. Contact a health care provider if: You have redness, mild swelling, or pain around your puncture site. You have fluid or blood coming from your puncture site that stops after applying firm pressure to the area. Your puncture site feels warm to the touch. You have pus or a bad smell coming from your puncture site. You have a fever. You have chest pain or discomfort that spreads  to your neck, jaw, or arm. You have chest pain that is worse with lying on your back or taking a deep breath. You are sweating a lot. You feel nauseous. You have a fast or irregular heartbeat. You have shortness of breath. You are dizzy or light-headed and feel the need to lie down. You have pain or numbness in the arm or leg closest to your puncture site. Get help right away if: Your puncture site suddenly  swells. Your puncture site is bleeding and the bleeding does not stop after applying firm pressure to the area. These symptoms may represent a serious problem that is an emergency. Do not wait to see if the symptoms will go away. Get medical help right away. Call your local emergency services (911 in the [place] Do not drive yourself to the hospital. Summary After the procedure, it is normal to have bruising and tenderness at the puncture site in your groin, neck, or forearm. Check your puncture site every day for signs of infection. Get help right away if your puncture site is bleeding and the bleeding does not stop after applying firm pressure to the area. This is a medical emergency. This information is not intended to replace advice given to you by your health care provider. Make sure you discuss any questions you have with your health care provider.

## 2022-12-16 NOTE — Transfer of Care (Signed)
Immediate Anesthesia Transfer of Care Note  Patient: Linda Michael  Procedure(s) Performed: SVT ABLATION  Patient Location: Cath Lab  Anesthesia Type:MAC  Level of Consciousness: awake, alert , and oriented  Airway & Oxygen Therapy: Patient Spontanous Breathing  Post-op Assessment: Report given to RN  Post vital signs: Reviewed and stable  Last Vitals:  Vitals Value Taken Time  BP 99/72 12/16/22 1538  Temp 36.4 C 12/16/22 1539  Pulse 81 12/16/22 1539  Resp 13 12/16/22 1539  SpO2 100 % 12/16/22 1539  Vitals shown include unvalidated device data.  Last Pain:  Vitals:   12/16/22 1539  TempSrc: Temporal  PainSc: 0-No pain         Complications: There were no known notable events for this encounter.

## 2022-12-16 NOTE — Anesthesia Preprocedure Evaluation (Addendum)
Anesthesia Evaluation  Patient identified by MRN, date of birth, ID band Patient awake    Reviewed: Allergy & Precautions, H&P , NPO status , Patient's Chart, lab work & pertinent test results  Airway Mallampati: II  TM Distance: >3 FB Neck ROM: Full    Dental no notable dental hx. (+) Teeth Intact, Dental Advisory Given   Pulmonary former smoker   Pulmonary exam normal breath sounds clear to auscultation       Cardiovascular + dysrhythmias Supra Ventricular Tachycardia  Rhythm:Regular Rate:Normal     Neuro/Psych   Anxiety     negative neurological ROS     GI/Hepatic negative GI ROS, Neg liver ROS,,,  Endo/Other  Hypothyroidism    Renal/GU negative Renal ROS  negative genitourinary   Musculoskeletal   Abdominal   Peds  Hematology negative hematology ROS (+)   Anesthesia Other Findings   Reproductive/Obstetrics negative OB ROS                             Anesthesia Physical Anesthesia Plan  ASA: 2  Anesthesia Plan: MAC   Post-op Pain Management: Tylenol PO (pre-op)*   Induction: Intravenous  PONV Risk Score and Plan: 4 or greater and Ondansetron, Midazolam and Propofol infusion  Airway Management Planned: Natural Airway and Simple Face Mask  Additional Equipment:   Intra-op Plan:   Post-operative Plan:   Informed Consent: I have reviewed the patients History and Physical, chart, labs and discussed the procedure including the risks, benefits and alternatives for the proposed anesthesia with the patient or authorized representative who has indicated his/her understanding and acceptance.     Dental advisory given  Plan Discussed with: CRNA  Anesthesia Plan Comments:        Anesthesia Quick Evaluation

## 2022-12-16 NOTE — Anesthesia Procedure Notes (Signed)
Procedure Name: MAC Date/Time: 12/16/2022 1:23 PM  Performed by: Valda Favia, CRNAPre-anesthesia Checklist: Patient identified, Emergency Drugs available, Suction available, Patient being monitored and Timeout performed Patient Re-evaluated:Patient Re-evaluated prior to induction Oxygen Delivery Method: Simple face mask Preoxygenation: Pre-oxygenation with 100% oxygen Induction Type: IV induction Placement Confirmation: positive ETCO2 Dental Injury: Teeth and Oropharynx as per pre-operative assessment

## 2022-12-16 NOTE — Interval H&P Note (Signed)
History and Physical Interval Note:  12/16/2022 12:20 PM  Linda Michael  has presented today for surgery, with the diagnosis of svt.  The various methods of treatment have been discussed with the patient and family. After consideration of risks, benefits and other options for treatment, the patient has consented to  Procedure(s): SVT ABLATION (N/A) as a surgical intervention.  The patient's history has been reviewed, patient examined, no change in status, stable for surgery.  I have reviewed the patient's chart and labs.  Questions were answered to the patient's satisfaction.    GEN: No acute distress.   Neck: No JVD Cardiac: RRR, no murmurs, rubs, or gallops.  Respiratory: Clear to auscultation bilaterally. GI: Soft, nontender, non-distended  MS: No edema; No deformity. Neuro:  Nonfocal  Psych: Normal affect    [name] T Siler Mavis

## 2022-12-17 ENCOUNTER — Encounter (HOSPITAL_COMMUNITY): Payer: Self-pay | Admitting: Cardiology

## 2022-12-17 NOTE — Anesthesia Postprocedure Evaluation (Signed)
Anesthesia Post Note  Patient: Linda Michael  Procedure(s) Performed: SVT ABLATION     Patient location during evaluation: Cath Lab Anesthesia Type: MAC Level of consciousness: awake and alert Pain management: pain level controlled Vital Signs Assessment: post-procedure vital signs reviewed and stable Respiratory status: spontaneous breathing, nonlabored ventilation, respiratory function stable and patient connected to nasal cannula oxygen Cardiovascular status: stable and blood pressure returned to baseline Postop Assessment: no apparent nausea or vomiting Anesthetic complications: no   There were no known notable events for this encounter.  Last Vitals:  Vitals:   12/16/22 1817 12/16/22 1847  BP: 117/83 127/76  Pulse: 75 75  Resp:    Temp:    SpO2: 98% 98%    Last Pain:  Vitals:   12/17/22 1422  TempSrc:   PainSc: [place]

## 2023-01-14 NOTE — Progress Notes (Signed)
Electrophysiology Office Follow up Visit Note:    Date:  01/15/2023   ID:  Linda Michael, DOB 18-Mar-1976, MRN [id number]  PCP:  Marinda Elk, MD  [facility] Cardiologist:  None  [facility] Electrophysiologist:  Vickie Epley, MD    Interval History:    Linda Michael is a 47 y.o. female who presents for a follow up visit. 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 She has done well since ablation without recurrence of her arrhythmia.       Past Medical History:  Diagnosis Date   Anxiety    Dysplastic nevus 10/02/2021   upper back left of midline MODERATE TO SEVERE ATYPIA, excision   Thyroid disease     Past Surgical History:  Procedure Laterality Date   CESAREAN SECTION     SVT ABLATION N/A 12/16/2022   Procedure: SVT ABLATION;  Surgeon: Vickie Epley, MD;  Location: [facility];  Service: Cardiovascular;  Laterality: N/A;    Current Medications: Current Meds  Medication Sig   ibuprofen (ADVIL) 800 MG tablet Take 800 mg by mouth as needed for mild pain, moderate pain or headache.   rizatriptan (MAXALT-MLT) 5 MG disintegrating tablet Take 5 mg by mouth as needed for migraine.     Allergies:   Sulfa antibiotics   Social History   Socioeconomic History   Marital status: Divorced    Spouse name: Not on file   Number of children: Not on file   Years of education: Not on file   Highest education level: Not on file  Occupational History   Not on file  Tobacco Use   Smoking status: Former    Types: Cigarettes    Start date: 10/10/1994    Quit date: 08/30/2013    Years since quitting: 9.3   Smokeless tobacco: Never  Vaping Use   Vaping Use: Never used  Substance and Sexual Activity   Alcohol use: No    Alcohol/week: 0.0 standard drinks of alcohol   Drug use: No   Sexual activity: Yes    Birth control/protection: None  Other Topics Concern   Not on file  Social History Narrative   Not on  file   Social Determinants of Health   Financial Resource Strain: Not on file  Food Insecurity: Not on file  Transportation Needs: Not on file  Physical Activity: Not on file  Stress: Not on file  Social Connections: Not on file     Family History: The patient's family history includes Alcohol abuse in her mother; Anxiety disorder in her father and mother; COPD in her mother; Cervical polyp in her mother; Depression in her father and mother; Drug abuse in her father and mother; Fibromyalgia in her mother; Hypertension in her father; Thyroid disease in her mother.  ROS:   Please see the history of present illness.    All other systems reviewed and are negative.  EKGs/Labs/Other Studies Reviewed:    The following studies were reviewed today:     Recent Labs: 12/02/2022: BUN 18; Creatinine, Ser 0.70; Hemoglobin 13.3; Platelets 317; Potassium 4.2; Sodium 138  Recent Lipid Panel No results found for: "CHOL", "TRIG", "HDL", "CHOLHDL", "VLDL", "LDLCALC", "LDLDIRECT"  Physical Exam:    VS:  BP 130/82   Pulse 76   Ht '5\' 2"'$  (1.575 m)   Wt 159 lb (72.1 kg)   BMI 29.08 kg/m     Wt Readings from Last 3 Encounters:  01/15/23 159 lb (72.1  kg)  12/16/22 154 lb (69.9 kg)  03/13/22 161 lb 8 oz (73.3 kg)     GEN:  Well nourished, well developed in no acute distress CARDIAC: RRR, no murmurs, rubs, gallops PSYCHIATRIC:  Normal affect        ASSESSMENT:    1. SVT (supraventricular tachycardia)    PLAN:    In order of problems listed above:  #SVT Doing well after her ablation in December. She is not on any nodal blockers or antiarrhythmics.  She can follow up with EP on as-needed basis.    Medication Adjustments/Labs and Tests Ordered: Current medicines are reviewed at length with the patient today.  Concerns regarding medicines are outlined above.  No orders of the defined types were placed in this encounter.  No orders of the defined types were placed in this  encounter.    Signed, Lars Mage, MD, [facility], [facility] 01/15/2023 9:57 AM    Electrophysiology [facility]

## 2023-01-15 ENCOUNTER — Ambulatory Visit: Payer: BC Managed Care – PPO | Attending: Cardiology | Admitting: Cardiology

## 2023-01-15 ENCOUNTER — Encounter: Payer: Self-pay | Admitting: Cardiology

## 2023-01-15 VITALS — BP 130/82 | HR 76 | Ht 62.0 in | Wt 159.0 lb

## 2023-01-15 DIAGNOSIS — I471 Supraventricular tachycardia, unspecified: Secondary | ICD-10-CM | POA: Diagnosis not present

## 2023-01-15 NOTE — Patient Instructions (Signed)
Medication Instructions:   Your physician recommends that you continue on your current medications as directed. Please refer to the Current Medication list given to you today.  *If you need a refill on your cardiac medications before your next appointment, please call your pharmacy*   Lab Work:  None Ordered  If you have labs (blood work) drawn today and your tests are completely normal, you will receive your results only by: MyChart Message (if you have MyChart) OR A paper copy in the mail If you have any lab test that is abnormal or we need to change your treatment, we will call you to review the results.   Testing/Procedures:  None Ordered   Follow-Up: At [facility], you and your health needs are our priority.  As part of our continuing mission to provide you with exceptional heart care, we have created designated Provider Care Teams.  These Care Teams include your primary Cardiologist (physician) and Advanced Practice Providers (APPs -  Physician Assistants and Nurse Practitioners) who all work together to provide you with the care you need, when you need it.  We recommend signing up for the patient portal called "MyChart".  Sign up information is provided on this After Visit Summary.  MyChart is used to connect with patients for Virtual Visits (Telemedicine).  Patients are able to view lab/test results, encounter notes, upcoming appointments, etc.  Non-urgent messages can be sent to your provider as well.   To learn more about what you can do with MyChart, go to [email or web address].    Your next appointment:    AS NEEDED  

## 2023-02-24 ENCOUNTER — Other Ambulatory Visit: Payer: Self-pay | Admitting: *Deleted

## 2023-02-24 ENCOUNTER — Inpatient Hospital Stay
Admission: RE | Admit: 2023-02-24 | Discharge: 2023-02-24 | Disposition: A | Discharge status: Home / Self Care | Payer: Self-pay | Source: Ambulatory Visit | Attending: *Deleted | Admitting: *Deleted

## 2023-02-24 DIAGNOSIS — Z1231 Encounter for screening mammogram for malignant neoplasm of breast: Secondary | ICD-10-CM

## 2023-08-06 ENCOUNTER — Encounter: Payer: Self-pay | Admitting: Gastroenterology

## 2023-08-14 ENCOUNTER — Encounter: Payer: Self-pay | Admitting: Gastroenterology

## 2023-08-14 NOTE — H&P (Signed)
Pre-Procedure H&P   Patient ID: Linda Michael is a 47 y.o. female.  Gastroenterology Provider: Jaynie Collins, DO  Referring Provider: Tawni Pummel, PA PCP: Patrice Paradise, MD  Date: 08/15/2023  HPI Linda Michael is a 47 y.o. female who presents today for Colonoscopy for Colorectal cancer screening .  Patient has dealt with chronic constipation.  With laxative use such as senna and MiraLAX she has experienced bowel movements on most days now.  No melena or hematochezia.  She does note incomplete emptying.  She had a normal colonoscopy in her 46s. She is status post ablation for SVT She is status post C-section x 2 Creatinine 0.7 hemoglobin 13.3 MCV 93 platelets 217,000 No family history of colon cancer or colon polyps   Past Medical History:  Diagnosis Date   Anxiety    Dysplastic nevus 10/02/2021   upper back left of midline MODERATE TO SEVERE ATYPIA, excision   Dysrhythmia    Hashimoto's thyroiditis    Headache    HPV (human papilloma virus) anogenital infection    HSV-2 (herpes simplex virus 2) infection    Hyperlipidemia    Hypothyroidism    PCOS (polycystic ovarian syndrome)    Thyroid disease    TMJ syndrome     Past Surgical History:  Procedure Laterality Date   BREAST SURGERY     CESAREAN SECTION     x 2   SVT ABLATION N/A 12/16/2022   Procedure: SVT ABLATION;  Surgeon: Lanier Prude, MD;  Location: [facility];  Service: Cardiovascular;  Laterality: N/A;    Family History No h/o GI disease or malignancy  Review of Systems  Constitutional:  Negative for activity change, appetite change, chills, diaphoresis, fatigue, fever and unexpected weight change.  HENT:  Negative for trouble swallowing and voice change.   Respiratory:  Negative for shortness of breath and wheezing.   Cardiovascular:  Negative for chest pain, palpitations and leg swelling.  Gastrointestinal:  Positive for constipation. Negative for  abdominal distention, abdominal pain, anal bleeding, blood in stool, diarrhea, nausea, rectal pain and vomiting.  Musculoskeletal:  Negative for arthralgias and myalgias.  Skin:  Negative for color change and pallor.  Neurological:  Negative for dizziness, syncope and weakness.  Psychiatric/Behavioral:  Negative for confusion.   All other systems reviewed and are negative.    Medications No current facility-administered medications on file prior to encounter.   Current Outpatient Medications on File Prior to Encounter  Medication Sig Dispense Refill   ibuprofen (ADVIL) 800 MG tablet Take 800 mg by mouth as needed for mild pain, moderate pain or headache.     rizatriptan (MAXALT-MLT) 5 MG disintegrating tablet Take 5 mg by mouth as needed for migraine.      Pertinent medications related to GI and procedure were reviewed by me with the patient prior to the procedure   Current Facility-Administered Medications:    0.9 %  sodium chloride infusion, , Intravenous, Continuous, Jaynie Collins, DO, Last Rate: 20 mL/hr at 08/15/23 4098, New Bag at 08/15/23 1191  sodium chloride 20 mL/hr at 08/15/23 4782       Allergies  Allergen Reactions   Sulfa Antibiotics Rash and Other (See Comments)    Joint pain   Allergies were reviewed by me prior to the procedure  Objective   Body mass index is 29.26 kg/m. Vitals:   08/15/23 0713  BP: 130/81  Pulse: 71  Resp: 16  Temp: (!) 97 F (36.1  C)  TempSrc: Temporal  SpO2: 98%  Weight: 72.6 kg  Height: 5\' 2"  (1.575 m)     Physical Exam Vitals and nursing note reviewed.  Constitutional:      General: She is not in acute distress.    Appearance: Normal appearance. She is not ill-appearing, toxic-appearing or diaphoretic.  HENT:     Head: Normocephalic and atraumatic.     Nose: Nose normal.     Mouth/Throat:     Mouth: Mucous membranes are moist.     Pharynx: Oropharynx is clear.  Eyes:     General: No scleral icterus.     Extraocular Movements: Extraocular movements intact.  Cardiovascular:     Rate and Rhythm: Normal rate and regular rhythm.     Heart sounds: Normal heart sounds. No murmur heard.    No friction rub. No gallop.  Pulmonary:     Effort: Pulmonary effort is normal. No respiratory distress.     Breath sounds: Normal breath sounds. No wheezing, rhonchi or rales.  Abdominal:     General: Bowel sounds are normal. There is no distension.     Palpations: Abdomen is soft.     Tenderness: There is no abdominal tenderness. There is no guarding or rebound.  Musculoskeletal:     Cervical back: Neck supple.     Right lower leg: No edema.     Left lower leg: No edema.  Skin:    General: Skin is warm and dry.     Coloration: Skin is not jaundiced or pale.  Neurological:     General: No focal deficit present.     Mental Status: She is alert and oriented to person, place, and time. Mental status is at baseline.  Psychiatric:        Mood and Affect: Mood normal.        Behavior: Behavior normal.        Thought Content: Thought content normal.        Judgment: Judgment normal.      Assessment:  Linda Michael is a 47 y.o. female  who presents today for Colonoscopy for Colorectal cancer screening .  Plan:  Colonoscopy with possible intervention today  Colonoscopy with possible biopsy, control of bleeding, polypectomy, and interventions as necessary has been discussed with the patient/patient representative. Informed consent was obtained from the patient/patient representative after explaining the indication, nature, and risks of the procedure including but not limited to death, bleeding, perforation, missed neoplasm/lesions, cardiorespiratory compromise, and reaction to medications. Opportunity for questions was given and appropriate answers were provided. Patient/patient representative has verbalized understanding is amenable to undergoing the procedure.   Jaynie Collins, DO   [facility]  Portions of the record may have been created with voice recognition software. Occasional wrong-word or 'sound-a-like' substitutions may have occurred due to the inherent limitations of voice recognition software.  Read the chart carefully and recognize, using context, where substitutions may have occurred.

## 2023-08-15 ENCOUNTER — Encounter
Admission: RE | Disposition: A | Discharge status: Home / Self Care | Payer: Self-pay | Source: Home / Self Care | Attending: Gastroenterology

## 2023-08-15 ENCOUNTER — Encounter: Payer: Self-pay | Admitting: Gastroenterology

## 2023-08-15 ENCOUNTER — Ambulatory Visit: Payer: BC Managed Care – PPO | Admitting: Anesthesiology

## 2023-08-15 ENCOUNTER — Ambulatory Visit
Admission: RE | Admit: 2023-08-15 | Discharge: 2023-08-15 | Disposition: A | Discharge status: Home / Self Care | Payer: BC Managed Care – PPO | Source: Home / Self Care | Attending: Gastroenterology | Admitting: Gastroenterology

## 2023-08-15 ENCOUNTER — Other Ambulatory Visit: Payer: Self-pay

## 2023-08-15 DIAGNOSIS — Z1211 Encounter for screening for malignant neoplasm of colon: Secondary | ICD-10-CM | POA: Diagnosis not present

## 2023-08-15 DIAGNOSIS — K635 Polyp of colon: Secondary | ICD-10-CM | POA: Diagnosis not present

## 2023-08-15 DIAGNOSIS — Z87891 Personal history of nicotine dependence: Secondary | ICD-10-CM | POA: Insufficient documentation

## 2023-08-15 DIAGNOSIS — K5909 Other constipation: Secondary | ICD-10-CM | POA: Diagnosis not present

## 2023-08-15 DIAGNOSIS — D123 Benign neoplasm of transverse colon: Secondary | ICD-10-CM | POA: Diagnosis not present

## 2023-08-15 DIAGNOSIS — K640 First degree hemorrhoids: Secondary | ICD-10-CM | POA: Insufficient documentation

## 2023-08-15 DIAGNOSIS — K621 Rectal polyp: Secondary | ICD-10-CM | POA: Insufficient documentation

## 2023-08-15 DIAGNOSIS — R519 Headache, unspecified: Secondary | ICD-10-CM | POA: Insufficient documentation

## 2023-08-15 DIAGNOSIS — D120 Benign neoplasm of cecum: Secondary | ICD-10-CM | POA: Insufficient documentation

## 2023-08-15 HISTORY — DX: Polycystic ovarian syndrome: E28.2

## 2023-08-15 HISTORY — DX: Hypothyroidism, unspecified: E03.9

## 2023-08-15 HISTORY — PX: POLYPECTOMY: SHX5525

## 2023-08-15 HISTORY — PX: HEMOSTASIS CLIP PLACEMENT: SHX6857

## 2023-08-15 HISTORY — DX: Herpesviral infection, unspecified: B00.9

## 2023-08-15 HISTORY — DX: Hyperlipidemia, unspecified: E78.5

## 2023-08-15 HISTORY — DX: Autoimmune thyroiditis: E06.3

## 2023-08-15 HISTORY — PX: HOT HEMOSTASIS: SHX5433

## 2023-08-15 HISTORY — DX: Cardiac arrhythmia, unspecified: I49.9

## 2023-08-15 HISTORY — PX: SUBMUCOSAL LIFTING INJECTION: SHX6855

## 2023-08-15 HISTORY — DX: Anogenital (venereal) warts: A63.0

## 2023-08-15 HISTORY — DX: Headache, unspecified: R51.9

## 2023-08-15 HISTORY — PX: COLONOSCOPY WITH PROPOFOL: SHX5780

## 2023-08-15 HISTORY — DX: Arthralgia of temporomandibular joint, unspecified side: M26.629

## 2023-08-15 LAB — POCT PREGNANCY, URINE: Preg Test, Ur: NEGATIVE

## 2023-08-15 SURGERY — COLONOSCOPY WITH PROPOFOL
Anesthesia: General

## 2023-08-15 MED ORDER — PROPOFOL 500 MG/50ML IV EMUL
INTRAVENOUS | Status: DC | PRN
  Administered 2023-08-15: 25 mg via INTRAVENOUS
  Administered 2023-08-15: 200 ug/kg/min via INTRAVENOUS
  Administered 2023-08-15: 25 mg via INTRAVENOUS

## 2023-08-15 MED ORDER — LIDOCAINE HCL (CARDIAC) PF 100 MG/5ML IV SOSY
PREFILLED_SYRINGE | INTRAVENOUS | Status: DC | PRN
  Administered 2023-08-15 (×2): 50 mg via INTRAVENOUS

## 2023-08-15 MED ORDER — SODIUM CHLORIDE (PF) 0.9 % IJ SOLN
INTRAMUSCULAR | Status: DC | PRN
Start: 2023-08-15 — End: 2023-08-15
  Administered 2023-08-15: 2 mL

## 2023-08-15 MED ORDER — SODIUM CHLORIDE 0.9 % IV SOLN: INTRAVENOUS | Status: DC

## 2023-08-15 NOTE — Anesthesia Postprocedure Evaluation (Signed)
Anesthesia Post Note  Patient: Linda Michael  Procedure(s) Performed: COLONOSCOPY WITH PROPOFOL POLYPECTOMY SUBMUCOSAL LIFTING INJECTION HEMOSTASIS CLIP PLACEMENT HOT HEMOSTASIS (ARGON PLASMA COAGULATION/BICAP)  Patient location during evaluation: PACU Anesthesia Type: General Level of consciousness: awake and alert, oriented and patient cooperative Pain management: pain level controlled Vital Signs Assessment: post-procedure vital signs reviewed and stable Respiratory status: spontaneous breathing, nonlabored ventilation and respiratory function stable Cardiovascular status: blood pressure returned to baseline and stable Postop Assessment: adequate PO intake Anesthetic complications: no   There were no known notable events for this encounter.   Last Vitals:  Vitals:   08/15/23 0858 08/15/23 0908  BP: 113/76 122/80  Pulse: 82   Resp:    Temp:    SpO2: 100%     Last Pain:  Vitals:   08/15/23 0908  TempSrc:   PainSc: 0-No pain                 Reed Breech

## 2023-08-15 NOTE — Transfer of Care (Signed)
Immediate Anesthesia Transfer of Care Note  Patient: Linda Michael  Procedure(s) Performed: COLONOSCOPY WITH PROPOFOL POLYPECTOMY SUBMUCOSAL LIFTING INJECTION HEMOSTASIS CLIP PLACEMENT HOT HEMOSTASIS (ARGON PLASMA COAGULATION/BICAP)  Patient Location: PACU  Anesthesia Type:General  Level of Consciousness: awake, alert , and oriented  Airway & Oxygen Therapy: Patient Spontanous Breathing  Post-op Assessment: Report given to RN  Post vital signs: stable  Last Vitals:  Vitals Value Taken Time  BP 119/76 08/15/23 0848  Temp 36.3 C 08/15/23 0848  Pulse 76 08/15/23 0849  Resp 16 08/15/23 0850  SpO2 99 % 08/15/23 0849  Vitals shown include unfiled device data.  Last Pain:  Vitals:   08/15/23 0848  TempSrc: Temporal  PainSc: 0-No pain         Complications: No notable events documented.

## 2023-08-15 NOTE — Interval H&P Note (Signed)
History and Physical Interval Note: Preprocedure H&P from 08/15/23  was reviewed and there was no interval change after seeing and examining the patient.  Written consent was obtained from the patient after discussion of risks, benefits, and alternatives. Patient has consented to proceed with Colonoscopy with possible intervention   08/15/2023 7:23 AM  Linda Michael  has presented today for surgery, with the diagnosis of V76.51 (ICD-9-CM) - Z12.11 (ICD-10-CM) - Colon cancer screening.  The various methods of treatment have been discussed with the patient and family. After consideration of risks, benefits and other options for treatment, the patient has consented to  Procedure(s): COLONOSCOPY WITH PROPOFOL (N/A) as a surgical intervention.  The patient's history has been reviewed, patient examined, no change in status, stable for surgery.  I have reviewed the patient's chart and labs.  Questions were answered to the patient's satisfaction.     Jaynie Collins

## 2023-08-15 NOTE — Op Note (Signed)
[facility] Gastroenterology Patient Name: Linda Michael Procedure Date: 08/15/2023 7:56 AM MRN: [id number] Account #: [id number] Date of Birth: 1976-03-21 Admit Type: Outpatient Age: 47 Room: [facility] ENDO ROOM 1 Gender: Female Note Status: Finalized Instrument Name: Colonscope [id number] Procedure:             Colonoscopy Indications:           Screening for colorectal malignant neoplasm Providers:             Jaynie Collins DO, DO Medicines:             Monitored Anesthesia Care Complications:         No immediate complications. Estimated blood loss:                         Minimal. Procedure:             Pre-Anesthesia Assessment:                        - Prior to the procedure, a History and Physical was                         performed, and patient medications and allergies were                         reviewed. The patient is competent. The risks and                         benefits of the procedure and the sedation options and                         risks were discussed with the patient. All questions                         were answered and informed consent was obtained.                         Patient identification and proposed procedure were                         verified by the physician, the nurse, the anesthetist                         and the technician in the endoscopy suite. Mental                         Status Examination: alert and oriented. Airway                         Examination: normal oropharyngeal airway and neck                         mobility. Respiratory Examination: clear to                         auscultation. CV Examination: RRR, no murmurs, no S3                         or S4. Prophylactic Antibiotics: The patient does not  require prophylactic antibiotics. Prior                         Anticoagulants: The patient has taken no anticoagulant                         or antiplatelet agents. ASA  Grade Assessment: II - A                         patient with mild systemic disease. After reviewing                         the risks and benefits, the patient was deemed in                         satisfactory condition to undergo the procedure. The                         anesthesia plan was to use monitored anesthesia care                         (MAC). Immediately prior to administration of                         medications, the patient was re-assessed for adequacy                         to receive sedatives. The heart rate, respiratory                         rate, oxygen saturations, blood pressure, adequacy of                         pulmonary ventilation, and response to care were                         monitored throughout the procedure. The physical                         status of the patient was re-assessed after the                         procedure.                        After obtaining informed consent, the colonoscope was                         passed under direct vision. Throughout the procedure,                         the patient's blood pressure, pulse, and oxygen                         saturations were monitored continuously. The                         Colonoscope was introduced through the anus and  advanced to the the terminal ileum, with                         identification of the appendiceal orifice and IC                         valve. The colonoscopy was performed without                         difficulty. The patient tolerated the procedure well.                         The quality of the bowel preparation was evaluated                         using the BBPS [facility] Bowel Preparation Scale) with                         scores of: Right Colon = 2 (minor amount of residual                         staining, small fragments of stool and/or opaque                         liquid, but mucosa seen well), Transverse Colon = 3                          (entire mucosa seen well with no residual staining,                         small fragments of stool or opaque liquid) and Left                         Colon = 2 (minor amount of residual staining, small                         fragments of stool and/or opaque liquid, but mucosa                         seen well). The total BBPS score equals 7. The quality                         of the bowel preparation was good. The terminal ileum,                         ileocecal valve, appendiceal orifice, and rectum were                         photographed. Findings:      The perianal and digital rectal examinations were normal. Pertinent       negatives include normal sphincter tone.      The terminal ileum appeared normal. Estimated blood loss: none.      Non-bleeding internal hemorrhoids were found during retroflexion. The       hemorrhoids were Grade I (internal hemorrhoids that do not prolapse).       Estimated blood loss: none.      A  12 to 13 mm polyp was found in the cecum. The polyp was sessile. Area       was successfully injected with 2 mL saline for lesion assessment, and       this injection appeared to lift the lesion adequately. Imaging was       performed using white light and narrow band imaging to visualize the       mucosa. The polyp was removed with a hot snare. Resection and retrieval       were complete. To prevent bleeding after the polypectomy, one hemostatic       clip was successfully placed (MR conditional). There was no bleeding at       the end of the procedure. Estimated blood loss was minimal.      Three sessile polyps were found in the transverse colon. The polyps were       3 to 6 mm in size. These polyps were removed with a cold snare.       Resection and retrieval were complete. Estimated blood loss was minimal.      Three sessile polyps were found in the rectum, sigmoid colon and       descending colon. The polyps were 1 to 2 mm in size. These polyps  were       removed with a jumbo cold forceps. Resection and retrieval were       complete. Estimated blood loss was minimal.      The exam was otherwise without abnormality on direct and retroflexion       views. Impression:            - The examined portion of the ileum was normal.                        - Non-bleeding internal hemorrhoids.                        - One 12 to 13 mm polyp in the cecum, removed with a                         hot snare. Resected and retrieved. Injected. Clip (MR                         conditional) was placed.                        - Three 3 to 6 mm polyps in the transverse colon,                         removed with a cold snare. Resected and retrieved.                        - Three 1 to 2 mm polyps in the rectum, in the sigmoid                         colon and in the descending colon, removed with a                         jumbo cold forceps. Resected and retrieved.                        -  The examination was otherwise normal on direct and                         retroflexion views. Recommendation:        - Patient has a contact number available for                         emergencies. The signs and symptoms of potential                         delayed complications were discussed with the patient.                         Return to normal activities tomorrow. Written                         discharge instructions were provided to the patient.                        - Discharge patient to home.                        - Resume previous diet.                        - Continue present medications.                        - No ibuprofen, naproxen, or other non-steroidal                         anti-inflammatory drugs for 5 days after polyp removal.                        - Await pathology results.                        - Repeat colonoscopy in 3 years for surveillance based                         on pathology results.                        - Return to  referring physician as previously                         scheduled.                        - The findings and recommendations were discussed with                         the patient. Procedure Code(s):     --- Professional ---                        [phone], Colonoscopy, flexible; with removal of                         tumor(s), polyp(s), or other lesion(s) by snare  technique                        [id number], 59, Colonoscopy, flexible; with biopsy, single                         or multiple                        [id number], Colonoscopy, flexible; with directed submucosal                         injection(s), any substance Diagnosis Code(s):     --- Professional ---                        Z12.11, Encounter for screening for malignant neoplasm                         of colon                        K64.0, First degree hemorrhoids                        D12.0, Benign neoplasm of cecum                        D12.3, Benign neoplasm of transverse colon (hepatic                         flexure or splenic flexure)                        D12.8, Benign neoplasm of rectum                        D12.5, Benign neoplasm of sigmoid colon                        D12.4, Benign neoplasm of descending colon CPT copyright 2022 American [employer] Association. All rights reserved. The codes documented in this report are preliminary and upon coder review may  be revised to meet current compliance requirements. Attending Participation:      I personally performed the entire procedure. Elfredia Nevins, DO Jaynie Collins DO, DO 08/15/2023 8:51:42 AM This report has been signed electronically. Number of Addenda: 0 Note Initiated On: 08/15/2023 7:56 AM Scope Withdrawal Time: 0 hours 33 minutes 46 seconds  Total Procedure Duration: 0 hours 40 minutes 24 seconds  Estimated Blood Loss:  Estimated blood loss was minimal.      [facility]

## 2023-08-15 NOTE — Anesthesia Preprocedure Evaluation (Addendum)
Anesthesia Evaluation  Patient identified by MRN, date of birth, ID band Patient awake    Reviewed: Allergy & Precautions, NPO status , Patient's Chart, lab work & pertinent test results  History of Anesthesia Complications Negative for: history of anesthetic complications  Airway Mallampati: IV   Neck ROM: Full    Dental no notable dental hx.    Pulmonary former smoker (quit 2014)   Pulmonary exam normal breath sounds clear to auscultation       Cardiovascular Normal cardiovascular exam+ dysrhythmias (SVT s/p ablation)  Rhythm:Regular Rate:Normal  ECG 12/16/22: normal   Neuro/Psych  Headaches PSYCHIATRIC DISORDERS Anxiety        GI/Hepatic negative GI ROS,,,  Endo/Other  Hypothyroidism  Prediabetes; PCOS  Renal/GU negative Renal ROS     Musculoskeletal   Abdominal   Peds  Hematology negative hematology ROS (+)   Anesthesia Other Findings Cardiology note 01/15/23:  #SVT Doing well after her ablation in December. She is not on any nodal blockers or antiarrhythmics.   She can follow up with EP on as-needed basis.   Reproductive/Obstetrics                             Anesthesia Physical Anesthesia Plan  ASA: 2  Anesthesia Plan: General   Post-op Pain Management:    Induction: Intravenous  PONV Risk Score and Plan: 3 and Propofol infusion, TIVA and Treatment may vary due to age or medical condition  Airway Management Planned: Natural Airway  Additional Equipment:   Intra-op Plan:   Post-operative Plan:   Informed Consent: I have reviewed the patients History and Physical, chart, labs and discussed the procedure including the risks, benefits and alternatives for the proposed anesthesia with the patient or authorized representative who has indicated his/her understanding and acceptance.       Plan Discussed with: CRNA  Anesthesia Plan Comments: (LMA/GETA backup discussed.   Patient consented for risks of anesthesia including but not limited to:  - adverse reactions to medications - damage to eyes, teeth, lips or other oral mucosa - nerve damage due to positioning  - sore throat or hoarseness - damage to heart, brain, nerves, lungs, other parts of body or loss of life  Informed patient about role of CRNA in peri- and intra-operative care.  Patient voiced understanding.)        Anesthesia Quick Evaluation
